# Patient Record
Sex: Female | Born: 1935
Health system: Southern US, Community
[De-identification: ages and names within clinical notes are randomized; demographics above are authoritative.]

## PROBLEM LIST (undated history)

## (undated) DIAGNOSIS — I10 Essential (primary) hypertension: Secondary | ICD-10-CM

## (undated) DIAGNOSIS — Z9889 Other specified postprocedural states: Secondary | ICD-10-CM

## (undated) DIAGNOSIS — E7800 Pure hypercholesterolemia, unspecified: Secondary | ICD-10-CM

## (undated) DIAGNOSIS — R112 Nausea with vomiting, unspecified: Secondary | ICD-10-CM

## (undated) HISTORY — DX: Essential (primary) hypertension: I10

## (undated) HISTORY — PX: CATARACT EXTRACTION: SUR2

---

## 2000-09-08 ENCOUNTER — Other Ambulatory Visit: Admission: RE | Admit: 2000-09-08 | Discharge: 2000-09-08 | Payer: Self-pay | Admitting: Dermatology

## 2001-07-07 ENCOUNTER — Emergency Department (HOSPITAL_COMMUNITY): Admission: EM | Admit: 2001-07-07 | Discharge: 2001-07-07 | Payer: Self-pay | Admitting: Emergency Medicine

## 2002-07-13 ENCOUNTER — Ambulatory Visit (HOSPITAL_COMMUNITY): Admission: RE | Admit: 2002-07-13 | Discharge: 2002-07-13 | Payer: Self-pay | Admitting: Pulmonary Disease

## 2002-08-06 ENCOUNTER — Ambulatory Visit (HOSPITAL_COMMUNITY): Admission: RE | Admit: 2002-08-06 | Discharge: 2002-08-06 | Payer: Self-pay | Admitting: Orthopaedic Surgery

## 2002-08-06 ENCOUNTER — Encounter: Payer: Self-pay | Admitting: Orthopaedic Surgery

## 2010-05-10 ENCOUNTER — Other Ambulatory Visit: Payer: Self-pay | Admitting: Ophthalmology

## 2010-05-10 ENCOUNTER — Encounter (HOSPITAL_COMMUNITY): Payer: Medicare Other | Attending: Ophthalmology

## 2010-05-10 DIAGNOSIS — Z01812 Encounter for preprocedural laboratory examination: Secondary | ICD-10-CM | POA: Insufficient documentation

## 2010-05-10 DIAGNOSIS — Z01810 Encounter for preprocedural cardiovascular examination: Secondary | ICD-10-CM | POA: Insufficient documentation

## 2010-05-10 LAB — BASIC METABOLIC PANEL
BUN: 11 mg/dL (ref 6–23)
CO2: 27 mEq/L (ref 19–32)
Chloride: 101 mEq/L (ref 96–112)
Creatinine, Ser: 0.62 mg/dL (ref 0.4–1.2)
Potassium: 4 mEq/L (ref 3.5–5.1)

## 2010-05-10 LAB — HEMOGLOBIN AND HEMATOCRIT, BLOOD: HCT: 38.2 % (ref 36.0–46.0)

## 2010-05-17 ENCOUNTER — Ambulatory Visit (HOSPITAL_COMMUNITY)
Admission: RE | Admit: 2010-05-17 | Discharge: 2010-05-17 | Disposition: A | Discharge status: Home / Self Care | Payer: Medicare Other | Source: Ambulatory Visit | Attending: Ophthalmology | Admitting: Ophthalmology

## 2010-05-17 DIAGNOSIS — H2510 Age-related nuclear cataract, unspecified eye: Secondary | ICD-10-CM | POA: Insufficient documentation

## 2010-08-01 ENCOUNTER — Encounter (HOSPITAL_COMMUNITY)
Admission: RE | Admit: 2010-08-01 | Discharge: 2010-08-01 | Disposition: A | Discharge status: Home / Self Care | Payer: Medicare Other | Source: Ambulatory Visit | Attending: Ophthalmology | Admitting: Ophthalmology

## 2010-08-01 LAB — BASIC METABOLIC PANEL
CO2: 30 mEq/L (ref 19–32)
Chloride: 102 mEq/L (ref 96–112)
Creatinine, Ser: 0.67 mg/dL (ref 0.50–1.10)
GFR calc Af Amer: >60 mL/min (ref >60)
Potassium: 4.2 mEq/L (ref 3.5–5.1)

## 2010-08-01 LAB — HEMOGLOBIN AND HEMATOCRIT, BLOOD
HCT: 37.8 % (ref 36.0–46.0)
Hemoglobin: 12.7 g/dL (ref 12.0–15.0)

## 2010-08-16 ENCOUNTER — Ambulatory Visit (HOSPITAL_COMMUNITY)
Admission: RE | Admit: 2010-08-16 | Discharge: 2010-08-16 | Disposition: A | Discharge status: Home / Self Care | Payer: Medicare Other | Source: Ambulatory Visit | Attending: Ophthalmology | Admitting: Ophthalmology

## 2010-08-16 DIAGNOSIS — Z01812 Encounter for preprocedural laboratory examination: Secondary | ICD-10-CM | POA: Insufficient documentation

## 2010-08-16 DIAGNOSIS — H2589 Other age-related cataract: Secondary | ICD-10-CM | POA: Insufficient documentation

## 2010-08-16 DIAGNOSIS — Z7982 Long term (current) use of aspirin: Secondary | ICD-10-CM | POA: Insufficient documentation

## 2010-08-20 NOTE — Op Note (Signed)
  NAMEGRISELLE, Hawkins NO.:  [id number]  MEDICAL RECORD NO.:  [id number]  LOCATION:  [facility]                          FACILITY:  [facility]  PHYSICIAN:  Susanne Greenhouse, MD       DATE OF BIRTH:  1930-12-28  DATE OF PROCEDURE:  08/16/2010 DATE OF DISCHARGE:                              OPERATIVE REPORT   PREOPERATIVE DIAGNOSIS:  Combined cataract, left eye.  Diagnosis code 366.19.  POSTOPERATIVE DIAGNOSIS:  Combined cataract, left eye.  Diagnosis code 366.19.  OPERATION PERFORMED:  Phacoemulsification with posterior chamber intraocular lens implantation, left eye.  No surgical specimens.  SURGEON:  Susanne Greenhouse, MD  ANESTHESIA:  General endotracheal anesthesia.  OPERATIVE SUMMARY:  In the preoperative area, dilating drops were placed into the left eye.  The patient was then brought into the operating room where she was placed under general anesthesia.  The eye was then prepped and draped.  Beginning with a 75 blade, a paracentesis port was made at the surgeon's 2 o'clock position.  The anterior chamber was then filled with a 1% nonpreserved lidocaine solution with epinephrine.  This was followed by Viscoat to deepen the chamber.  A small fornix-based peritomy was performed superiorly.  Next, a single iris hook was placed through the limbus superiorly.  A 2.4-mm keratome blade was then used to make a clear corneal incision over the iris hook.  A bent cystotome needle and Utrata forceps were used to create a continuous tear capsulotomy.  Hydrodissection was performed using balanced salt solution on a fine cannula.  The lens nucleus was then removed using phacoemulsification in a quadrant cracking technique.  The cortical material was then removed with irrigation and aspiration.  The capsular bag and anterior chamber were refilled with Provisc.  The wound was widened to approximately 3 mm and a posterior chamber intraocular lens was placed into the capsular bag  without difficulty using an [employer] lens injecting system.  A single 10-0 nylon suture was then used to close the incision as well as stromal hydration.  The Provisc was removed from the anterior chamber and capsular bag with irrigation and aspiration.  At this point, the wounds were tested for leak, which were negative.  The anterior chamber remained deep and stable.  The patient tolerated the procedure well.  There were no operative complications, and she awoke from general anesthesia without problem.  Prosthetic device used is a Lenstec posterior chamber lens model Softec HD, power of 21.5, serial number is [id number].          ______________________________ Susanne Greenhouse, MD     KEH/MEDQ  D:  08/16/2010  T:  08/17/2010  Job:  [id number]  Electronically Signed by Gemma Payor MD on 08/20/2010 12:44:57 PM

## 2012-02-23 ENCOUNTER — Encounter (HOSPITAL_COMMUNITY): Payer: Self-pay | Admitting: *Deleted

## 2012-02-23 ENCOUNTER — Emergency Department (HOSPITAL_COMMUNITY)
Admission: EM | Admit: 2012-02-23 | Discharge: 2012-02-23 | Disposition: A | Discharge status: Home / Self Care | Payer: Medicare Other | Attending: Emergency Medicine | Admitting: Emergency Medicine

## 2012-02-23 ENCOUNTER — Emergency Department (HOSPITAL_COMMUNITY): Payer: Medicare Other

## 2012-02-23 DIAGNOSIS — Z87891 Personal history of nicotine dependence: Secondary | ICD-10-CM | POA: Insufficient documentation

## 2012-02-23 DIAGNOSIS — Z79899 Other long term (current) drug therapy: Secondary | ICD-10-CM | POA: Insufficient documentation

## 2012-02-23 DIAGNOSIS — Y93E8 Activity, other personal hygiene: Secondary | ICD-10-CM | POA: Insufficient documentation

## 2012-02-23 DIAGNOSIS — E7800 Pure hypercholesterolemia, unspecified: Secondary | ICD-10-CM | POA: Insufficient documentation

## 2012-02-23 DIAGNOSIS — S62109A Fracture of unspecified carpal bone, unspecified wrist, initial encounter for closed fracture: Secondary | ICD-10-CM

## 2012-02-23 DIAGNOSIS — W19XXXA Unspecified fall, initial encounter: Secondary | ICD-10-CM | POA: Insufficient documentation

## 2012-02-23 DIAGNOSIS — S52509A Unspecified fracture of the lower end of unspecified radius, initial encounter for closed fracture: Secondary | ICD-10-CM | POA: Insufficient documentation

## 2012-02-23 DIAGNOSIS — Y92009 Unspecified place in unspecified non-institutional (private) residence as the place of occurrence of the external cause: Secondary | ICD-10-CM | POA: Insufficient documentation

## 2012-02-23 HISTORY — DX: Pure hypercholesterolemia, unspecified: E78.00

## 2012-02-23 MED ORDER — OXYCODONE-ACETAMINOPHEN 5-325 MG PO TABS: 1.0000 | ORAL_TABLET | Freq: Once | ORAL | Status: DC

## 2012-02-23 MED ORDER — OXYCODONE-ACETAMINOPHEN 5-325 MG PO TABS
1.0000 | ORAL_TABLET | Freq: Once | ORAL | Status: AC
  Administered 2012-02-23: 1 via ORAL
  Filled 2012-02-23: qty 1

## 2012-02-23 MED ORDER — OXYCODONE-ACETAMINOPHEN 5-325 MG PO TABS: 1.0000 | ORAL_TABLET | ORAL | Status: DC | PRN

## 2012-02-23 NOTE — ED Notes (Addendum)
Pt states that she fell while trying to put on her pants, unsure of what caused her to fall when asked, pt c/o pain to left wrist area, pain and swelling noted to left wrist, cms intact distal. Ice pack and splint applied at triage. Cms remains intact after splint

## 2012-02-23 NOTE — ED Provider Notes (Signed)
History     CSN: [id number]  Arrival date & time 02/23/12  1547   First MD Initiated Contact with Patient 02/23/12 1713      Chief Complaint  Patient presents with  . Fall  . Wrist Pain    (Consider location/radiation/quality/duration/timing/severity/associated sxs/prior treatment) HPI Patient lost balance and fell putting on pants this a.m.  She injured left wrist, but denies other injuries.  Denies head injury or loc, chest pain.  Complaining of pain left wrist, patient states fingers feel numb with ice in place.  PMD- Juanetta Gosling, orthoHilda Lias.  Past Medical History  Diagnosis Date  . High cholesterol     Past Surgical History  Procedure Date  . Cataract extraction     No family history on file.  History  Substance Use Topics  . Smoking status: Former Games developer  . Smokeless tobacco: Not on file  . Alcohol Use: No    OB History    Grav Para Term Preterm Abortions TAB SAB Ect Mult Living                  Review of Systems  All other systems reviewed and are negative.    Allergies  Review of patient's allergies indicates no known allergies.  Home Medications   Current Outpatient Rx  Name  Route  Sig  Dispense  Refill  . SIMVASTATIN 20 MG PO TABS   Oral   Take 20 mg by mouth daily.           BP 148/78  Pulse 77  Temp 97.9 F (36.6 C) (Oral)  Resp 16  Ht 5\' 5"  (1.651 m)  Wt 175 lb (79.379 kg)  BMI 29.12 kg/m2  SpO2 100%  Physical Exam  Nursing note and vitals reviewed. Constitutional: She is oriented to person, place, and time. She appears well-developed and well-nourished.  HENT:  Head: Normocephalic and atraumatic.  Eyes: Conjunctivae normal are normal. Pupils are equal, round, and reactive to light.  Neck: Normal range of motion. Neck supple.  Cardiovascular: Normal rate.   Pulmonary/Chest: Effort normal.  Abdominal: Soft.  Musculoskeletal: She exhibits tenderness.       Swelling tenderness left wrist, fingers pink with 2 point sensation  and movement intact.  Radial pulse two plus, no tenderness over elbow or shoulder. Entire spine nontender  Neurological: She is alert and oriented to person, place, and time.  Skin: Skin is warm and dry.  Psychiatric: She has a normal mood and affect.    ED Course  Procedures (including critical care time)  Labs Reviewed - No data to display Dg Wrist Complete Left  02/23/2012  *RADIOLOGY REPORT*  Clinical Data: Left wrist pain and swelling secondary to a fall.  LEFT WRIST - COMPLETE 3+ VIEW  Comparison: None.  Findings: There is an avulsion of the ulnar styloid with a dorsally impacted fracture of the distal radius.  Fairly severe arthritic changes of the first carpal metacarpal joint and between the scaphoid and the trapezium and trapezoid.  IMPRESSION: Fractures of the distal radius and ulna as described.  The fracture does involve the articular surface of the distal radius.   Original Report Authenticated By: Francene Boyers, M.D.      No diagnosis found.    MDM  Wrist fracture.  Sugar tong splint placed and discussed with Dr. Romeo Apple and patient will be seen by him in follow up tomorrow at 1430.       Hilario Quarry, MD 02/23/12 2108

## 2012-02-23 NOTE — ED Notes (Signed)
Patient with no complaints at this time. Respirations even and unlabored. Skin warm/dry. Discharge instructions reviewed with patient at this time. Patient given opportunity to voice concerns/ask questions. Patient discharged at this time and left Emergency Department with steady gait.   

## 2012-02-24 ENCOUNTER — Encounter: Payer: Self-pay | Admitting: Orthopedic Surgery

## 2012-02-24 ENCOUNTER — Ambulatory Visit (INDEPENDENT_AMBULATORY_CARE_PROVIDER_SITE_OTHER): Payer: Medicare Other | Admitting: Orthopedic Surgery

## 2012-02-24 VITALS — BP 140/58 | Ht 65.0 in | Wt 175.0 lb

## 2012-02-24 DIAGNOSIS — S52599A Other fractures of lower end of unspecified radius, initial encounter for closed fracture: Secondary | ICD-10-CM

## 2012-02-24 DIAGNOSIS — S52509A Unspecified fracture of the lower end of unspecified radius, initial encounter for closed fracture: Secondary | ICD-10-CM | POA: Insufficient documentation

## 2012-02-24 MED ORDER — OXYCODONE-ACETAMINOPHEN 5-325 MG PO TABS: 1.0000 | ORAL_TABLET | ORAL | Status: DC | PRN

## 2012-02-24 NOTE — Progress Notes (Deleted)
Patient ID: Tammy Hawkins, female   DOB: [birth year], 79 y.o.   MRN: [id number] Chief Complaint  Patient presents with  . Wrist Injury    ER follow up, left wrist fracture. DOI 02-23-12.    This is a 79 year old female who jumped off a car to avoid being bitten by a dog on Friday, 02/21/2012. He fell injured his right knee now he can extend it. He was evaluated in the emergency room and found to have a torn quadriceps muscle. He had an MRI which showed complete tear with 2.5 cm of retraction. He comes in with bruising swelling lack of knee extension and he is on crutches is not complaining of a significant amount of pain  This overboard review of systems includes constipation blurred vision and watering of the eyes he denies weight gain weight loss chest pain shortness of breath frequency or urgency skin changes numbness tingling nervousness anxiety easy bleeding or bruising excessive thirst or urination or adverse reactions to food  Past Medical History  Diagnosis Date  . High cholesterol     Past Surgical History  Procedure Date  . Cataract extraction     Physical Exam  Nursing note and vitals reviewed. Constitutional: She is oriented to person, place, and time. She appears well-developed and well-nourished. No distress.  HENT:  Head: Normocephalic.  Eyes: Pupils are equal, round, and reactive to light.  Neck: Normal range of motion. No tracheal deviation present.  Cardiovascular: Normal rate and intact distal pulses.   Pulmonary/Chest: Effort normal.  Abdominal: Soft.  Musculoskeletal:       Upper extremity exam  The right and left upper extremity:  Inspection and palpation revealed no abnormalities in the upper extremities.  Range of motion is full without contracture. Motor exam is normal with grade 5 strength. The joints are fully reduced without subluxation. There is no atrophy or tremor and muscle tone is normal.  All joints are stable.    Lower extremity  exam  Ambulation is normal.  The  left lower extremity:  Inspection and palpation revealed no tenderness or abnormality in alignment in the lower extremities. Range of motion is full.  Strength is grade 5.  All joints are stable.  Right knee evaluation. The patient has the knee in 90 flexion is no extension has a palpable defect at the quadriceps and patellar junction with swelling and ecchemosis noted in the skin. The knee is otherwise stable.  No malalignment is seen       Lymphadenopathy:    She has no cervical adenopathy.  Neurological: She is alert and oriented to person, place, and time. She has normal reflexes.  Skin: Skin is warm and dry. She is not diaphoretic.  Psychiatric: She has a normal mood and affect. Her behavior is normal. Judgment and thought content normal.   Knee x-rays were negative except for some mild arthrosis  The MRI was reviewed and there is indeed a torn quadriceps tendon from the patella with retraction  This will require surgical treatment with repair  Explain the procedure to the patient including the postoperative rehabilitation course. There is really no other alternative treatment to obtain a functional knee.  Diagnosis right quadriceps tendon rupture  Plan open repair right quadriceps tendon

## 2012-02-24 NOTE — Patient Instructions (Addendum)
Keep  Cast dry   Do not get wet   If it gets wet dry with a hair dryer on low setting and call the office   

## 2012-02-25 NOTE — Progress Notes (Signed)
Patient ID: Tammy Hawkins, female   DOB: [birth year], 79 y.o.   MRN: [id number] Chief Complaint  Patient presents with  . Wrist Injury    ER follow up, left wrist fracture. DOI 02-23-12.    Pain left wrist patient fell at home or getting dressed in her bedroom  Date of injury January 12  Emergency room  visit. Placed in splint. Complains of sharp 9/10 constant pain with numbness swelling decreased range of motion. She denies medical problems. She denies review of systems issues.   BP 140/58  Ht 5\' 5"  (1.651 m)  Wt 175 lb (79.379 kg)  BMI 29.12 kg/m2 Physical Exam(12) GENERAL: normal development   CDV: pulses are normal   Skin: normal  Lymph: nodes were not palpable/normal  Psychiatric: awake, alert and oriented  Neuro: normal sensation  Lower extremity exam  Ambulation is normal.  The right and left lower extremity:  Inspection and palpation revealed no tenderness or abnormality in alignment in the lower extremities. Range of motion is full.  Strength is grade 5.  All joints are stable.    Left arm evaluation tenderness over the distal radius mild dorsal prominence. Pain swelling loss of motion normal muscle tone. Decreased strength. No instability.  X-ray shows a slightly dorsally displaced slightly angulated distal radius fracture.  I discussed options with the patient my recommendations worse for closed reduction and pinning she says she has taken her disabled husband and cannot do the surgery  She was placed in a short arm cast  X-ray in 2 weeks  Diagnosis left distal radius fracture  Plan short arm cast treatment.

## 2012-02-25 NOTE — Addendum Note (Signed)
Addended by: Vickki Hearing on: 02/25/2012 08:35 AM   Modules accepted: Level of Service

## 2012-02-26 ENCOUNTER — Ambulatory Visit: Payer: Medicare Other | Admitting: Orthopedic Surgery

## 2012-03-09 ENCOUNTER — Ambulatory Visit (INDEPENDENT_AMBULATORY_CARE_PROVIDER_SITE_OTHER): Payer: Medicare Other | Admitting: Orthopedic Surgery

## 2012-03-09 ENCOUNTER — Ambulatory Visit (INDEPENDENT_AMBULATORY_CARE_PROVIDER_SITE_OTHER): Payer: Medicare Other

## 2012-03-09 VITALS — Ht 65.0 in | Wt 175.0 lb

## 2012-03-09 DIAGNOSIS — S52509A Unspecified fracture of the lower end of unspecified radius, initial encounter for closed fracture: Secondary | ICD-10-CM

## 2012-03-09 DIAGNOSIS — S52599A Other fractures of lower end of unspecified radius, initial encounter for closed fracture: Secondary | ICD-10-CM

## 2012-03-09 DIAGNOSIS — S62109A Fracture of unspecified carpal bone, unspecified wrist, initial encounter for closed fracture: Secondary | ICD-10-CM

## 2012-03-09 MED ORDER — OXYCODONE-ACETAMINOPHEN 5-325 MG PO TABS: 1.0000 | ORAL_TABLET | ORAL | Status: DC | PRN

## 2012-03-09 NOTE — Patient Instructions (Addendum)
Keep  Cast dry   Do not get wet   If it gets wet dry with a hair dryer on low setting and call the office   

## 2012-03-09 NOTE — Progress Notes (Signed)
Patient ID: Tammy Hawkins, female   DOB: [birth year], 79 y.o.   MRN: [id number] Chief Complaint  Patient presents with  . Follow-up    2 week recheck on left wrist fracture. DOI 02-23-12.    X-ray in the cast shows no change in position of the intra-articular distal radius fracture. She is maintained relative height and radial inclination with a neutral joint surface  Cast intact  Continue current pain medication x-ray out of plaster in 2 weeks

## 2012-03-30 ENCOUNTER — Ambulatory Visit (INDEPENDENT_AMBULATORY_CARE_PROVIDER_SITE_OTHER): Payer: Medicare Other

## 2012-03-30 ENCOUNTER — Encounter: Payer: Self-pay | Admitting: Orthopedic Surgery

## 2012-03-30 ENCOUNTER — Ambulatory Visit (INDEPENDENT_AMBULATORY_CARE_PROVIDER_SITE_OTHER): Payer: Medicare Other | Admitting: Orthopedic Surgery

## 2012-03-30 VITALS — BP 140/60 | Ht 65.0 in | Wt 175.0 lb

## 2012-03-30 DIAGNOSIS — S62109A Fracture of unspecified carpal bone, unspecified wrist, initial encounter for closed fracture: Secondary | ICD-10-CM

## 2012-03-30 MED ORDER — HYDROCODONE-ACETAMINOPHEN 5-325 MG PO TABS: 1.0000 | ORAL_TABLET | ORAL | Status: DC | PRN

## 2012-03-30 NOTE — Progress Notes (Signed)
Patient ID: Tammy Hawkins, female   DOB: 02-23-1930, 79 y.o.   MRN: [id number] Chief Complaint  Patient presents with  . Follow-up    Follow up left wrist fracture DOI 02/23/12    BP 140/60  Ht 5\' 5"  (1.651 m)  Wt 175 lb (79.379 kg)  BMI 29.12 kg/m2  The patient has tenderness at the area of the hand. No tenderness at the fracture site.  Healed distal radius fracture.  Xray report  LEFT wrist x-rays.  3 views.  Fracture.  Comminuted fracture, distal radius with normal axial alignment on AP and lateral x-rays. There is some comminution and callus forming around the fracture. No displacement.  Impression healing distal radius fracture. The LEFT wrist  Advices to perform exercises as I have instructed, she is placed in a cockup wrist splint removable to wear for one month.  Follow with [place] as needed

## 2012-03-30 NOTE — Patient Instructions (Signed)
Wear brace x 1 month then remove  Activities as tolerated after 1 month   Exercises as instructed   Return as needed

## 2012-04-06 ENCOUNTER — Telehealth: Payer: Self-pay | Admitting: *Deleted

## 2012-04-06 NOTE — Telephone Encounter (Signed)
Patient called and stated Saturday and Sunday she was having pain in left wrist. Also, complained of  her fingers going numb after she would take her brace off and not having any strength to hold anything. Advised patient that maybe she needed to try losing the brace and told her it would take time to regain strength in her wrist after the fracture, but to continue exercises that was given to her. Patient did state that she was not having any pain this morning and denies numbness. Told patient to call back for appointment if symptoms did not improve.

## 2012-04-20 ENCOUNTER — Encounter: Payer: Self-pay | Admitting: Orthopedic Surgery

## 2012-04-20 ENCOUNTER — Ambulatory Visit (INDEPENDENT_AMBULATORY_CARE_PROVIDER_SITE_OTHER): Payer: Medicare Other

## 2012-04-20 ENCOUNTER — Ambulatory Visit (INDEPENDENT_AMBULATORY_CARE_PROVIDER_SITE_OTHER): Payer: Medicare Other | Admitting: Orthopedic Surgery

## 2012-04-20 VITALS — BP 130/70 | Ht 65.0 in | Wt 175.0 lb

## 2012-04-20 DIAGNOSIS — M19049 Primary osteoarthritis, unspecified hand: Secondary | ICD-10-CM | POA: Insufficient documentation

## 2012-04-20 DIAGNOSIS — M25532 Pain in left wrist: Secondary | ICD-10-CM

## 2012-04-20 DIAGNOSIS — M25539 Pain in unspecified wrist: Secondary | ICD-10-CM

## 2012-04-20 DIAGNOSIS — S52532A Colles' fracture of left radius, initial encounter for closed fracture: Secondary | ICD-10-CM | POA: Insufficient documentation

## 2012-04-20 DIAGNOSIS — G5602 Carpal tunnel syndrome, left upper limb: Secondary | ICD-10-CM

## 2012-04-20 DIAGNOSIS — G5600 Carpal tunnel syndrome, unspecified upper limb: Secondary | ICD-10-CM | POA: Insufficient documentation

## 2012-04-20 DIAGNOSIS — S52532S Colles' fracture of left radius, sequela: Secondary | ICD-10-CM

## 2012-04-20 DIAGNOSIS — S42309S Unspecified fracture of shaft of humerus, unspecified arm, sequela: Secondary | ICD-10-CM

## 2012-04-20 MED ORDER — HYDROCODONE-ACETAMINOPHEN 7.5-325 MG PO TABS: 1.0000 | ORAL_TABLET | ORAL | Status: DC | PRN

## 2012-04-20 NOTE — Progress Notes (Signed)
Patient ID: Tammy Hawkins, female   DOB: Apr 02, 1930, 79 y.o.   MRN: [id number] Chief Complaint  Patient presents with  . Follow-up    Having severe pain left wrist   BP 130/70  Ht 5\' 5"  (1.651 m)  Wt 175 lb (79.379 kg)  BMI 29.12 kg/m2  The patient had a fracture of her distal radius retreated close with casting followed by bracing she presents back with severe pain in her left hand and wrist and numbness and tingling in her fingers. She's been in a fracture brace for several weeks now  The pain is in the base of the left thumb as well as the top of the hand and somewhat over the distal radius  Review of systems reveals complaints of numbness and tingling and weakness in the left hand with difficulty picking up small objects and difficulty with fine motor tasks  BP 130/70  Ht 5\' 5"  (1.651 m)  Wt 175 lb (79.379 kg)  BMI 29.12 kg/m2 General appearance is normal, the patient is alert and oriented x3 with normal mood and affect. Left hand and wrist evaluation  Radial and ulnar pulses are normal. No lymphadenopathy is noted in the epitrochlear region.   Swelling is noted over the distal radius but I think this is more from the brace fitting and the residual position of the fracture. She has a little tenderness at the fracture but more pain and tenderness at the base of the second and third metacarpal at the carpometacarpal joint. She has tenderness at the base of the thumb with a positive grind test. She has decreased sensation in her fingertips. Her grip strength is weak her range of motion at the wrist joint is less than normal. No instability is felt scaphoid is nontender.  X-ray was repeated  X-ray shows acceptable alignment of the fracture with some mild displacement and angulation but within the criteria for nonoperative treatment with less than 10 mm of shortening less than 5 angulation  Impression Colles' fracture of left radius, sequela - Plan: HYDROcodone-acetaminophen (NORCO)  7.5-325 MG per tablet  Wrist pain, left - Plan: DG Wrist Complete Left, HYDROcodone-acetaminophen (NORCO) 7.5-325 MG per tablet  CTS (carpal tunnel syndrome), left - Plan: HYDROcodone-acetaminophen (NORCO) 7.5-325 MG per tablet  Arthritis of hand, degenerative, left - Plan: HYDROcodone-acetaminophen (NORCO) 7.5-325 MG per tablet    Increase her pain medication for the next 3-4 weeks. Apply topical cream for the next 3-4 weeks 4 times a day wear a carpal tunnel splint. Repeat evaluation in 4 weeks.

## 2012-04-20 NOTE — Patient Instructions (Addendum)
Diagnosis: Carpal tunnel syndrome Arthritis   Apply warm compress to hand   Apply muscle rub 4 x a day   Wear splint until you come back   Carpal Tunnel Syndrome The carpal tunnel is a narrow area located on the palm side of your wrist. The tunnel is formed by the wrist bones and ligaments. Nerves, blood vessels, and tendons pass through the carpal tunnel. Repeated wrist motion or certain diseases may cause swelling within the tunnel. This swelling pinches the main nerve in the wrist (median nerve) and causes the painful hand and arm condition called carpal tunnel syndrome. CAUSES   Repeated wrist motions.  Wrist injuries.  Certain diseases like arthritis, diabetes, alcoholism, hyperthyroidism, and kidney failure.  Obesity.  Pregnancy. SYMPTOMS   A "pins and needles" feeling in your fingers or hand.  Tingling or numbness in your fingers or hand.  An aching feeling in your entire arm.  Wrist pain that goes up your arm to your shoulder.  Pain that goes down into your palm or fingers.  A weak feeling in your hands. DIAGNOSIS  Your caregiver will take your history and perform a physical exam. An electromyography test may be needed. This test measures electrical signals sent out by the muscles. The electrical signals are usually slowed by carpal tunnel syndrome. You may also need X-rays. TREATMENT  Carpal tunnel syndrome may clear up by itself. Your caregiver may recommend a wrist splint or medicine such as a nonsteroidal anti-inflammatory medicine. Cortisone injections may help. Sometimes, surgery may be needed to free the pinched nerve.  HOME CARE INSTRUCTIONS   Take all medicine as directed by your caregiver. Only take over-the-counter or prescription medicines for pain, discomfort, or fever as directed by your caregiver.  If you were given a splint to keep your wrist from bending, wear it as directed. It is important to wear the splint at night. Wear the splint for as long  as you have pain or numbness in your hand, arm, or wrist. This may take 1 to 2 months.  Rest your wrist from any activity that may be causing your pain. If your symptoms are work-related, you may need to talk to your employer about changing to a job that does not require using your wrist.  Put ice on your wrist after long periods of wrist activity.  Put ice in a plastic bag.  Place a towel between your skin and the bag.  Leave the ice on for 15 to 20 minutes, 3 to 4 times a day.  Keep all follow-up visits as directed by your caregiver. This includes any orthopedic referrals, physical therapy, and rehabilitation. Any delay in getting necessary care could result in a delay or failure of your condition to heal. SEEK IMMEDIATE MEDICAL CARE IF:   You have new, unexplained symptoms.  Your symptoms get worse and are not helped or controlled with medicines. MAKE SURE YOU:   Understand these instructions.  Will watch your condition.  Will get help right away if you are not doing well or get worse. Document Released: 01/26/2000 Document Revised: 04/22/2011 Document Reviewed: 12/14/2010 [facility] Patient Information 2013 [place], [place].

## 2012-05-20 ENCOUNTER — Ambulatory Visit (INDEPENDENT_AMBULATORY_CARE_PROVIDER_SITE_OTHER): Payer: Medicare Other | Admitting: Orthopedic Surgery

## 2012-05-20 ENCOUNTER — Encounter: Payer: Self-pay | Admitting: Orthopedic Surgery

## 2012-05-20 VITALS — BP 118/60 | Ht 65.0 in | Wt 175.0 lb

## 2012-05-20 DIAGNOSIS — S52532S Colles' fracture of left radius, sequela: Secondary | ICD-10-CM

## 2012-05-20 DIAGNOSIS — S42309S Unspecified fracture of shaft of humerus, unspecified arm, sequela: Secondary | ICD-10-CM

## 2012-05-20 DIAGNOSIS — G5602 Carpal tunnel syndrome, left upper limb: Secondary | ICD-10-CM

## 2012-05-20 DIAGNOSIS — G5600 Carpal tunnel syndrome, unspecified upper limb: Secondary | ICD-10-CM

## 2012-05-20 DIAGNOSIS — M19049 Primary osteoarthritis, unspecified hand: Secondary | ICD-10-CM

## 2012-05-20 NOTE — Progress Notes (Signed)
Patient ID: Tammy Hawkins, female   DOB: 10-12-1930, 79 y.o.   MRN: [id number] Chief Complaint  Patient presents with  . Follow-up    4 week recheck of CTS and OA of left hand.    History this patient had a fractured left distal radius several months ago presents now with continued pain and numbness and tingling and weakness of her left upper extremity. She's had a course of therapy and strengthening exercises with no avail but she does respond well to her wrist splint.  She denies pain radiating up into the shoulder  BP 118/60  Ht 5\' 5"  (1.651 m)  Wt 175 lb (79.379 kg)  BMI 29.12 kg/m2 On inspection we find a normal alignment left wrist with normal passive range of motion slight decreased flexion and weakness in power grip wrist joint is stable there is tenderness over the joint and the carpal tunnel skin is intact incision healed with negative Tinel's normal perfusion and decreased sensation in all 5 digits  Impression posttraumatic carpal tunnel syndrome left upper extremity  Are recommended surgery she would like to wait continue strengthening exercises and splinting and come back in 3 months or sooner if she decides to have surgery

## 2012-05-20 NOTE — Patient Instructions (Addendum)
activities as tolerated   Work on strengthening of the hand   Carpal Tunnel Syndrome The carpal tunnel is a narrow area located on the palm side of your wrist. The tunnel is formed by the wrist bones and ligaments. Nerves, blood vessels, and tendons pass through the carpal tunnel. Repeated wrist motion or certain diseases may cause swelling within the tunnel. This swelling pinches the main nerve in the wrist (median nerve) and causes the painful hand and arm condition called carpal tunnel syndrome. CAUSES   Repeated wrist motions.  Wrist injuries.  Certain diseases like arthritis, diabetes, alcoholism, hyperthyroidism, and kidney failure.  Obesity.  Pregnancy. SYMPTOMS   A "pins and needles" feeling in your fingers or hand.  Tingling or numbness in your fingers or hand.  An aching feeling in your entire arm.  Wrist pain that goes up your arm to your shoulder.  Pain that goes down into your palm or fingers.  A weak feeling in your hands. DIAGNOSIS  Your caregiver will take your history and perform a physical exam. An electromyography test may be needed. This test measures electrical signals sent out by the muscles. The electrical signals are usually slowed by carpal tunnel syndrome. You may also need X-rays. TREATMENT  Carpal tunnel syndrome may clear up by itself. Your caregiver may recommend a wrist splint or medicine such as a nonsteroidal anti-inflammatory medicine. Cortisone injections may help. Sometimes, surgery may be needed to free the pinched nerve.  HOME CARE INSTRUCTIONS   Take all medicine as directed by your caregiver. Only take over-the-counter or prescription medicines for pain, discomfort, or fever as directed by your caregiver.  If you were given a splint to keep your wrist from bending, wear it as directed. It is important to wear the splint at night. Wear the splint for as long as you have pain or numbness in your hand, arm, or wrist. This may take 1 to 2  months.  Rest your wrist from any activity that may be causing your pain. If your symptoms are work-related, you may need to talk to your employer about changing to a job that does not require using your wrist.  Put ice on your wrist after long periods of wrist activity.  Put ice in a plastic bag.  Place a towel between your skin and the bag.  Leave the ice on for 15 to 20 minutes, 3 to 4 times a day.  Keep all follow-up visits as directed by your caregiver. This includes any orthopedic referrals, physical therapy, and rehabilitation. Any delay in getting necessary care could result in a delay or failure of your condition to heal. SEEK IMMEDIATE MEDICAL CARE IF:   You have new, unexplained symptoms.  Your symptoms get worse and are not helped or controlled with medicines. MAKE SURE YOU:   Understand these instructions.  Will watch your condition.  Will get help right away if you are not doing well or get worse. Document Released: 01/26/2000 Document Revised: 04/22/2011 Document Reviewed: 12/14/2010 [facility] Patient Information 2013 [place], [place].

## 2014-05-17 DIAGNOSIS — I10 Essential (primary) hypertension: Secondary | ICD-10-CM | POA: Diagnosis not present

## 2014-05-31 DIAGNOSIS — I10 Essential (primary) hypertension: Secondary | ICD-10-CM | POA: Diagnosis not present

## 2014-06-13 DIAGNOSIS — R7309 Other abnormal glucose: Secondary | ICD-10-CM | POA: Diagnosis not present

## 2014-06-13 DIAGNOSIS — R635 Abnormal weight gain: Secondary | ICD-10-CM | POA: Diagnosis not present

## 2014-06-13 DIAGNOSIS — I10 Essential (primary) hypertension: Secondary | ICD-10-CM | POA: Diagnosis not present

## 2014-06-13 DIAGNOSIS — M1990 Unspecified osteoarthritis, unspecified site: Secondary | ICD-10-CM | POA: Diagnosis not present

## 2014-06-13 DIAGNOSIS — E785 Hyperlipidemia, unspecified: Secondary | ICD-10-CM | POA: Diagnosis not present

## 2014-09-20 DIAGNOSIS — H524 Presbyopia: Secondary | ICD-10-CM | POA: Diagnosis not present

## 2014-09-20 DIAGNOSIS — Z9849 Cataract extraction status, unspecified eye: Secondary | ICD-10-CM | POA: Diagnosis not present

## 2014-09-20 DIAGNOSIS — H52223 Regular astigmatism, bilateral: Secondary | ICD-10-CM | POA: Diagnosis not present

## 2014-09-20 DIAGNOSIS — Z961 Presence of intraocular lens: Secondary | ICD-10-CM | POA: Diagnosis not present

## 2014-10-12 DIAGNOSIS — M79672 Pain in left foot: Secondary | ICD-10-CM | POA: Diagnosis not present

## 2014-10-12 DIAGNOSIS — M722 Plantar fascial fibromatosis: Secondary | ICD-10-CM | POA: Diagnosis not present

## 2014-11-03 DIAGNOSIS — M79671 Pain in right foot: Secondary | ICD-10-CM | POA: Diagnosis not present

## 2014-11-03 DIAGNOSIS — M722 Plantar fascial fibromatosis: Secondary | ICD-10-CM | POA: Diagnosis not present

## 2014-11-21 DIAGNOSIS — I10 Essential (primary) hypertension: Secondary | ICD-10-CM | POA: Diagnosis not present

## 2014-11-21 DIAGNOSIS — J201 Acute bronchitis due to Hemophilus influenzae: Secondary | ICD-10-CM | POA: Diagnosis not present

## 2014-12-14 DIAGNOSIS — J209 Acute bronchitis, unspecified: Secondary | ICD-10-CM | POA: Diagnosis not present

## 2014-12-14 DIAGNOSIS — J309 Allergic rhinitis, unspecified: Secondary | ICD-10-CM | POA: Diagnosis not present

## 2014-12-14 DIAGNOSIS — M1990 Unspecified osteoarthritis, unspecified site: Secondary | ICD-10-CM | POA: Diagnosis not present

## 2014-12-14 DIAGNOSIS — I10 Essential (primary) hypertension: Secondary | ICD-10-CM | POA: Diagnosis not present

## 2015-06-13 DIAGNOSIS — M1990 Unspecified osteoarthritis, unspecified site: Secondary | ICD-10-CM | POA: Diagnosis not present

## 2015-06-13 DIAGNOSIS — R739 Hyperglycemia, unspecified: Secondary | ICD-10-CM | POA: Diagnosis not present

## 2015-06-13 DIAGNOSIS — E785 Hyperlipidemia, unspecified: Secondary | ICD-10-CM | POA: Diagnosis not present

## 2015-06-13 DIAGNOSIS — I10 Essential (primary) hypertension: Secondary | ICD-10-CM | POA: Diagnosis not present

## 2015-10-24 DIAGNOSIS — M1990 Unspecified osteoarthritis, unspecified site: Secondary | ICD-10-CM | POA: Diagnosis not present

## 2015-10-24 DIAGNOSIS — I209 Angina pectoris, unspecified: Secondary | ICD-10-CM | POA: Diagnosis not present

## 2015-10-24 DIAGNOSIS — G4700 Insomnia, unspecified: Secondary | ICD-10-CM | POA: Diagnosis not present

## 2015-10-24 DIAGNOSIS — R739 Hyperglycemia, unspecified: Secondary | ICD-10-CM | POA: Diagnosis not present

## 2015-10-24 DIAGNOSIS — M545 Low back pain: Secondary | ICD-10-CM | POA: Diagnosis not present

## 2015-10-24 DIAGNOSIS — Z23 Encounter for immunization: Secondary | ICD-10-CM | POA: Diagnosis not present

## 2015-10-24 DIAGNOSIS — I10 Essential (primary) hypertension: Secondary | ICD-10-CM | POA: Diagnosis not present

## 2015-11-08 DIAGNOSIS — H524 Presbyopia: Secondary | ICD-10-CM | POA: Diagnosis not present

## 2016-06-10 DIAGNOSIS — M1990 Unspecified osteoarthritis, unspecified site: Secondary | ICD-10-CM | POA: Diagnosis not present

## 2016-06-10 DIAGNOSIS — I10 Essential (primary) hypertension: Secondary | ICD-10-CM | POA: Diagnosis not present

## 2016-06-10 DIAGNOSIS — R52 Pain, unspecified: Secondary | ICD-10-CM | POA: Diagnosis not present

## 2016-06-10 DIAGNOSIS — E785 Hyperlipidemia, unspecified: Secondary | ICD-10-CM | POA: Diagnosis not present

## 2016-06-10 DIAGNOSIS — R5383 Other fatigue: Secondary | ICD-10-CM | POA: Diagnosis not present

## 2016-07-12 DIAGNOSIS — M1990 Unspecified osteoarthritis, unspecified site: Secondary | ICD-10-CM | POA: Diagnosis not present

## 2016-07-12 DIAGNOSIS — I10 Essential (primary) hypertension: Secondary | ICD-10-CM | POA: Diagnosis not present

## 2016-07-12 DIAGNOSIS — J301 Allergic rhinitis due to pollen: Secondary | ICD-10-CM | POA: Diagnosis not present

## 2017-04-03 DIAGNOSIS — I10 Essential (primary) hypertension: Secondary | ICD-10-CM | POA: Diagnosis not present

## 2017-04-03 DIAGNOSIS — E785 Hyperlipidemia, unspecified: Secondary | ICD-10-CM | POA: Diagnosis not present

## 2017-04-03 DIAGNOSIS — M1990 Unspecified osteoarthritis, unspecified site: Secondary | ICD-10-CM | POA: Diagnosis not present

## 2017-04-03 DIAGNOSIS — J301 Allergic rhinitis due to pollen: Secondary | ICD-10-CM | POA: Diagnosis not present

## 2017-07-01 DIAGNOSIS — I10 Essential (primary) hypertension: Secondary | ICD-10-CM | POA: Diagnosis not present

## 2017-07-01 DIAGNOSIS — Z23 Encounter for immunization: Secondary | ICD-10-CM | POA: Diagnosis not present

## 2017-07-01 DIAGNOSIS — Z0000 Encounter for general adult medical examination without abnormal findings: Secondary | ICD-10-CM | POA: Diagnosis not present

## 2017-07-01 DIAGNOSIS — E785 Hyperlipidemia, unspecified: Secondary | ICD-10-CM | POA: Diagnosis not present

## 2017-07-01 DIAGNOSIS — R5383 Other fatigue: Secondary | ICD-10-CM | POA: Diagnosis not present

## 2017-07-01 DIAGNOSIS — M1990 Unspecified osteoarthritis, unspecified site: Secondary | ICD-10-CM | POA: Diagnosis not present

## 2017-09-10 DIAGNOSIS — R05 Cough: Secondary | ICD-10-CM | POA: Diagnosis not present

## 2017-09-10 DIAGNOSIS — J209 Acute bronchitis, unspecified: Secondary | ICD-10-CM | POA: Diagnosis not present

## 2017-10-21 DIAGNOSIS — Z9849 Cataract extraction status, unspecified eye: Secondary | ICD-10-CM | POA: Diagnosis not present

## 2017-10-21 DIAGNOSIS — H52223 Regular astigmatism, bilateral: Secondary | ICD-10-CM | POA: Diagnosis not present

## 2017-10-21 DIAGNOSIS — Z961 Presence of intraocular lens: Secondary | ICD-10-CM | POA: Diagnosis not present

## 2017-10-21 DIAGNOSIS — H5203 Hypermetropia, bilateral: Secondary | ICD-10-CM | POA: Diagnosis not present

## 2018-03-17 DIAGNOSIS — E785 Hyperlipidemia, unspecified: Secondary | ICD-10-CM | POA: Diagnosis not present

## 2018-03-17 DIAGNOSIS — M1990 Unspecified osteoarthritis, unspecified site: Secondary | ICD-10-CM | POA: Diagnosis not present

## 2018-03-17 DIAGNOSIS — I10 Essential (primary) hypertension: Secondary | ICD-10-CM | POA: Diagnosis not present

## 2018-03-17 DIAGNOSIS — G4700 Insomnia, unspecified: Secondary | ICD-10-CM | POA: Diagnosis not present

## 2018-09-03 DIAGNOSIS — Z0000 Encounter for general adult medical examination without abnormal findings: Secondary | ICD-10-CM | POA: Diagnosis not present

## 2018-12-22 DIAGNOSIS — C4490 Unspecified malignant neoplasm of skin, unspecified: Secondary | ICD-10-CM | POA: Diagnosis not present

## 2018-12-22 DIAGNOSIS — I10 Essential (primary) hypertension: Secondary | ICD-10-CM | POA: Diagnosis not present

## 2018-12-30 DIAGNOSIS — C44529 Squamous cell carcinoma of skin of other part of trunk: Secondary | ICD-10-CM | POA: Diagnosis not present

## 2019-01-14 DIAGNOSIS — C44529 Squamous cell carcinoma of skin of other part of trunk: Secondary | ICD-10-CM | POA: Diagnosis not present

## 2019-03-29 DIAGNOSIS — Z08 Encounter for follow-up examination after completed treatment for malignant neoplasm: Secondary | ICD-10-CM | POA: Diagnosis not present

## 2019-03-29 DIAGNOSIS — Z85828 Personal history of other malignant neoplasm of skin: Secondary | ICD-10-CM | POA: Diagnosis not present

## 2019-03-29 DIAGNOSIS — L820 Inflamed seborrheic keratosis: Secondary | ICD-10-CM | POA: Diagnosis not present

## 2019-03-29 DIAGNOSIS — L308 Other specified dermatitis: Secondary | ICD-10-CM | POA: Diagnosis not present

## 2019-05-14 DIAGNOSIS — Z0001 Encounter for general adult medical examination with abnormal findings: Secondary | ICD-10-CM | POA: Diagnosis not present

## 2019-05-14 DIAGNOSIS — I10 Essential (primary) hypertension: Secondary | ICD-10-CM | POA: Diagnosis not present

## 2019-05-14 DIAGNOSIS — R739 Hyperglycemia, unspecified: Secondary | ICD-10-CM | POA: Diagnosis not present

## 2019-05-14 DIAGNOSIS — Z79899 Other long term (current) drug therapy: Secondary | ICD-10-CM | POA: Diagnosis not present

## 2019-05-14 DIAGNOSIS — E785 Hyperlipidemia, unspecified: Secondary | ICD-10-CM | POA: Diagnosis not present

## 2019-05-14 DIAGNOSIS — E119 Type 2 diabetes mellitus without complications: Secondary | ICD-10-CM | POA: Diagnosis not present

## 2019-05-21 DIAGNOSIS — Z0001 Encounter for general adult medical examination with abnormal findings: Secondary | ICD-10-CM | POA: Diagnosis not present

## 2019-05-21 DIAGNOSIS — E785 Hyperlipidemia, unspecified: Secondary | ICD-10-CM | POA: Diagnosis not present

## 2019-05-21 DIAGNOSIS — I10 Essential (primary) hypertension: Secondary | ICD-10-CM | POA: Diagnosis not present

## 2019-07-06 DIAGNOSIS — R05 Cough: Secondary | ICD-10-CM | POA: Diagnosis not present

## 2019-09-06 DIAGNOSIS — R05 Cough: Secondary | ICD-10-CM | POA: Diagnosis not present

## 2019-10-16 DIAGNOSIS — I672 Cerebral atherosclerosis: Secondary | ICD-10-CM | POA: Diagnosis not present

## 2019-10-16 DIAGNOSIS — H8110 Benign paroxysmal vertigo, unspecified ear: Secondary | ICD-10-CM | POA: Diagnosis not present

## 2019-10-16 DIAGNOSIS — M25512 Pain in left shoulder: Secondary | ICD-10-CM | POA: Diagnosis not present

## 2019-10-16 DIAGNOSIS — G8929 Other chronic pain: Secondary | ICD-10-CM | POA: Diagnosis not present

## 2019-10-16 DIAGNOSIS — G9389 Other specified disorders of brain: Secondary | ICD-10-CM | POA: Diagnosis not present

## 2019-10-16 DIAGNOSIS — G319 Degenerative disease of nervous system, unspecified: Secondary | ICD-10-CM | POA: Diagnosis not present

## 2019-10-16 DIAGNOSIS — N390 Urinary tract infection, site not specified: Secondary | ICD-10-CM | POA: Diagnosis not present

## 2019-10-16 DIAGNOSIS — M5416 Radiculopathy, lumbar region: Secondary | ICD-10-CM | POA: Diagnosis not present

## 2019-10-16 DIAGNOSIS — R42 Dizziness and giddiness: Secondary | ICD-10-CM | POA: Diagnosis not present

## 2019-10-16 DIAGNOSIS — R200 Anesthesia of skin: Secondary | ICD-10-CM | POA: Diagnosis not present

## 2019-10-20 DIAGNOSIS — I10 Essential (primary) hypertension: Secondary | ICD-10-CM | POA: Diagnosis not present

## 2019-10-20 DIAGNOSIS — Z823 Family history of stroke: Secondary | ICD-10-CM | POA: Diagnosis not present

## 2019-10-20 DIAGNOSIS — N390 Urinary tract infection, site not specified: Secondary | ICD-10-CM | POA: Diagnosis not present

## 2019-10-21 ENCOUNTER — Other Ambulatory Visit (HOSPITAL_COMMUNITY): Payer: Self-pay | Admitting: Internal Medicine

## 2019-10-21 DIAGNOSIS — R42 Dizziness and giddiness: Secondary | ICD-10-CM

## 2019-10-21 DIAGNOSIS — R202 Paresthesia of skin: Secondary | ICD-10-CM

## 2019-10-28 ENCOUNTER — Other Ambulatory Visit: Payer: Self-pay

## 2019-10-28 ENCOUNTER — Ambulatory Visit (HOSPITAL_COMMUNITY)
Admission: RE | Admit: 2019-10-28 | Discharge: 2019-10-28 | Disposition: A | Discharge status: Home / Self Care | Payer: Medicare Other | Source: Ambulatory Visit | Attending: Internal Medicine | Admitting: Internal Medicine

## 2019-10-28 DIAGNOSIS — R202 Paresthesia of skin: Secondary | ICD-10-CM

## 2019-10-28 DIAGNOSIS — R42 Dizziness and giddiness: Secondary | ICD-10-CM | POA: Diagnosis not present

## 2019-10-28 DIAGNOSIS — R200 Anesthesia of skin: Secondary | ICD-10-CM | POA: Diagnosis not present

## 2019-10-28 DIAGNOSIS — R531 Weakness: Secondary | ICD-10-CM | POA: Diagnosis not present

## 2019-10-28 DIAGNOSIS — H748X2 Other specified disorders of left middle ear and mastoid: Secondary | ICD-10-CM | POA: Diagnosis not present

## 2019-10-28 DIAGNOSIS — G319 Degenerative disease of nervous system, unspecified: Secondary | ICD-10-CM | POA: Diagnosis not present

## 2019-11-01 ENCOUNTER — Other Ambulatory Visit (HOSPITAL_COMMUNITY): Payer: Self-pay | Admitting: Internal Medicine

## 2019-11-01 DIAGNOSIS — Z8673 Personal history of transient ischemic attack (TIA), and cerebral infarction without residual deficits: Secondary | ICD-10-CM

## 2019-11-05 ENCOUNTER — Ambulatory Visit (HOSPITAL_COMMUNITY)
Admission: RE | Admit: 2019-11-05 | Discharge: 2019-11-05 | Disposition: A | Discharge status: Home / Self Care | Payer: Medicare Other | Source: Ambulatory Visit | Attending: Internal Medicine | Admitting: Internal Medicine

## 2019-11-05 ENCOUNTER — Other Ambulatory Visit: Payer: Self-pay

## 2019-11-05 DIAGNOSIS — Z8673 Personal history of transient ischemic attack (TIA), and cerebral infarction without residual deficits: Secondary | ICD-10-CM | POA: Insufficient documentation

## 2019-11-05 DIAGNOSIS — I771 Stricture of artery: Secondary | ICD-10-CM | POA: Diagnosis not present

## 2019-11-05 DIAGNOSIS — I6523 Occlusion and stenosis of bilateral carotid arteries: Secondary | ICD-10-CM | POA: Diagnosis not present

## 2019-11-05 DIAGNOSIS — E785 Hyperlipidemia, unspecified: Secondary | ICD-10-CM | POA: Diagnosis not present

## 2019-11-05 DIAGNOSIS — Z720 Tobacco use: Secondary | ICD-10-CM | POA: Diagnosis not present

## 2019-12-09 DIAGNOSIS — Z23 Encounter for immunization: Secondary | ICD-10-CM | POA: Diagnosis not present

## 2019-12-09 DIAGNOSIS — G9001 Carotid sinus syncope: Secondary | ICD-10-CM | POA: Diagnosis not present

## 2019-12-09 DIAGNOSIS — I10 Essential (primary) hypertension: Secondary | ICD-10-CM | POA: Diagnosis not present

## 2020-03-08 DIAGNOSIS — I10 Essential (primary) hypertension: Secondary | ICD-10-CM | POA: Diagnosis not present

## 2020-03-08 DIAGNOSIS — M1990 Unspecified osteoarthritis, unspecified site: Secondary | ICD-10-CM | POA: Diagnosis not present

## 2020-03-08 DIAGNOSIS — R059 Cough, unspecified: Secondary | ICD-10-CM | POA: Diagnosis not present

## 2020-04-12 DIAGNOSIS — M7062 Trochanteric bursitis, left hip: Secondary | ICD-10-CM | POA: Diagnosis not present

## 2020-04-12 DIAGNOSIS — I10 Essential (primary) hypertension: Secondary | ICD-10-CM | POA: Diagnosis not present

## 2020-06-05 DIAGNOSIS — J019 Acute sinusitis, unspecified: Secondary | ICD-10-CM | POA: Diagnosis not present

## 2020-06-05 DIAGNOSIS — B9789 Other viral agents as the cause of diseases classified elsewhere: Secondary | ICD-10-CM | POA: Diagnosis not present

## 2020-06-05 DIAGNOSIS — Z20822 Contact with and (suspected) exposure to covid-19: Secondary | ICD-10-CM | POA: Diagnosis not present

## 2020-06-20 DIAGNOSIS — N390 Urinary tract infection, site not specified: Secondary | ICD-10-CM | POA: Diagnosis not present

## 2020-07-13 DIAGNOSIS — N390 Urinary tract infection, site not specified: Secondary | ICD-10-CM | POA: Diagnosis not present

## 2020-07-13 DIAGNOSIS — I10 Essential (primary) hypertension: Secondary | ICD-10-CM | POA: Diagnosis not present

## 2020-10-19 DIAGNOSIS — I10 Essential (primary) hypertension: Secondary | ICD-10-CM | POA: Diagnosis not present

## 2020-10-19 DIAGNOSIS — R059 Cough, unspecified: Secondary | ICD-10-CM | POA: Diagnosis not present

## 2020-10-19 DIAGNOSIS — Z79899 Other long term (current) drug therapy: Secondary | ICD-10-CM | POA: Diagnosis not present

## 2020-10-19 DIAGNOSIS — R319 Hematuria, unspecified: Secondary | ICD-10-CM | POA: Diagnosis not present

## 2020-11-01 DIAGNOSIS — N281 Cyst of kidney, acquired: Secondary | ICD-10-CM | POA: Diagnosis not present

## 2020-11-01 DIAGNOSIS — R1032 Left lower quadrant pain: Secondary | ICD-10-CM | POA: Diagnosis not present

## 2020-11-01 DIAGNOSIS — R9431 Abnormal electrocardiogram [ECG] [EKG]: Secondary | ICD-10-CM | POA: Diagnosis not present

## 2020-11-01 DIAGNOSIS — R059 Cough, unspecified: Secondary | ICD-10-CM | POA: Diagnosis not present

## 2020-11-01 DIAGNOSIS — R918 Other nonspecific abnormal finding of lung field: Secondary | ICD-10-CM | POA: Diagnosis not present

## 2020-11-01 DIAGNOSIS — K5710 Diverticulosis of small intestine without perforation or abscess without bleeding: Secondary | ICD-10-CM | POA: Diagnosis not present

## 2020-11-01 DIAGNOSIS — K635 Polyp of colon: Secondary | ICD-10-CM | POA: Diagnosis not present

## 2020-11-01 DIAGNOSIS — R109 Unspecified abdominal pain: Secondary | ICD-10-CM | POA: Diagnosis not present

## 2020-11-01 DIAGNOSIS — K6389 Other specified diseases of intestine: Secondary | ICD-10-CM | POA: Diagnosis not present

## 2020-11-01 DIAGNOSIS — R0789 Other chest pain: Secondary | ICD-10-CM | POA: Diagnosis not present

## 2020-11-01 DIAGNOSIS — I700 Atherosclerosis of aorta: Secondary | ICD-10-CM | POA: Diagnosis not present

## 2020-11-01 DIAGNOSIS — R079 Chest pain, unspecified: Secondary | ICD-10-CM | POA: Diagnosis not present

## 2020-11-01 DIAGNOSIS — K449 Diaphragmatic hernia without obstruction or gangrene: Secondary | ICD-10-CM | POA: Diagnosis not present

## 2020-11-01 DIAGNOSIS — K639 Disease of intestine, unspecified: Secondary | ICD-10-CM | POA: Diagnosis not present

## 2020-11-01 DIAGNOSIS — K5730 Diverticulosis of large intestine without perforation or abscess without bleeding: Secondary | ICD-10-CM | POA: Diagnosis not present

## 2020-11-07 DIAGNOSIS — C787 Secondary malignant neoplasm of liver and intrahepatic bile duct: Secondary | ICD-10-CM | POA: Diagnosis not present

## 2020-11-08 ENCOUNTER — Telehealth: Payer: Self-pay | Admitting: Gastroenterology

## 2020-11-08 NOTE — Telephone Encounter (Signed)
Tammy Hawkins, patient is aware. Please put her in the [phone] slot. Thanks!

## 2020-11-08 NOTE — Telephone Encounter (Signed)
Stacey:  I received a call from Dr. Willey Blade yesterday about this nice lady. She is a new patient, no prior GI that I am aware.   CT 11/01/20 in [place] with concern for primary colonic neoplasm and evidence for metastatic disease to liver and lung.   She needs an urgent appointment here. I have a 930 am opening on 9/29. If she is able to come then, please arrange appointment.   IF she is not able to come, we need to see if any other APP has an availability this week or next. Needs to be seen ASAP.   If still no openings, let me know. Will need to discuss with Reba

## 2020-11-09 ENCOUNTER — Encounter: Payer: Self-pay | Admitting: Gastroenterology

## 2020-11-09 ENCOUNTER — Ambulatory Visit (INDEPENDENT_AMBULATORY_CARE_PROVIDER_SITE_OTHER): Payer: Medicare Other | Admitting: Gastroenterology

## 2020-11-09 ENCOUNTER — Telehealth: Payer: Self-pay

## 2020-11-09 ENCOUNTER — Other Ambulatory Visit: Payer: Self-pay

## 2020-11-09 DIAGNOSIS — K6389 Other specified diseases of intestine: Secondary | ICD-10-CM | POA: Diagnosis not present

## 2020-11-09 MED ORDER — ONDANSETRON 4 MG PO TBDP: 4.0000 mg | ORAL_TABLET | Freq: Three times a day (TID) | ORAL | 3 refills | Status: DC | PRN

## 2020-11-09 NOTE — Telephone Encounter (Signed)
Flex Sig instructions given at OV. Tammy Kaufman NP advised pt can take 1 fleet enema to hospital to be given at endo instead of doing enemas at home. Pt and friend Tammy Hawkins) informed. Note made on procedure instructions.

## 2020-11-09 NOTE — Patient Instructions (Addendum)
We are arranging a CT of your chest as soon as possible.  We are also arranging a flexible sigmoidoscopy to take biopsies of the colon mass.   Please go straight to the emergency room if vomiting, unable to tolerate liquids, severe abdominal pain, or worsening abdominal swelling.  I have sent in Zofran to take for nausea every 8 hours as needed.   Further recommendations to follow!  It was a pleasure to see you today. I want to create trusting relationships with patients to provide genuine, compassionate, and quality care. I value your feedback. If you receive a survey regarding your visit,  I greatly appreciate you taking time to fill this out.   Annitta Needs, PhD, ANP-BC [facility]

## 2020-11-09 NOTE — H&P (View-Only) (Signed)
  Primary Care Physician:  Fagan, Roy, MD Referring Physician: Dr. Fagan  Primary Gastroenterologist:  Dr. Carver  Chief Complaint  Patient presents with   colon cancer with metastatic disease to liver and lungs    Per PCP   Abdominal Pain    Right side    HPI:   Tammy Hawkins is an 87 y.o. female presenting today at the request of Dr. Fagan due to CT findings on 11/01/09 of primary colonic neoplasm and metastatic disease to liver and lung. See CT report below. Outside CBC on 9/21 with Hgb 9.9, Hct 30.7, Platelets 385. Mild leukocytosis of 12.2. LFTs with AST 114, Alk Phos 151, Tbili 0.7.    Notes lower extremity edema when starting Tramadol. Went to ED with RUQ pain. Hurting in chest and all over abdomen. Felt like she was about to die. No rectal bleeding. Has been taking laxatives for many years. Chronic history of constipation. When taking taking a laxative, stool comes out soft and mushy. Takes a a laxative about every other day. States just an OTC laxative. No weight loss. Gets full easily. Some nausea recently.   No prior colonoscopy. Friend, Kay Fletcher, is present.   Past Medical History:  Diagnosis Date   High cholesterol     Past Surgical History:  Procedure Laterality Date   CATARACT EXTRACTION      Current Outpatient Medications  Medication Sig Dispense Refill   ALPRAZolam (XANAX) 0.5 MG tablet Take 0.5 mg by mouth 2 (two) times daily.     aspirin EC 81 MG tablet Take 81 mg by mouth daily. Swallow whole.     HYDROcodone-acetaminophen (NORCO/VICODIN) 5-325 MG tablet Take 1 tablet by mouth every 6 (six) hours as needed for moderate pain.     Multiple Vitamin (MULTIVITAMIN) tablet Take 1 tablet by mouth daily.     Omega-3 Fatty Acids (FISH OIL) 1000 MG CAPS Take by mouth daily.     simvastatin (ZOCOR) 40 MG tablet Take 40 mg by mouth daily.     traZODone (DESYREL) 100 MG tablet Take 100 mg by mouth at bedtime.     valsartan (DIOVAN) 80 MG tablet Take 80 mg by  mouth at bedtime.     No current facility-administered medications for this visit.    Allergies as of 11/09/2020   (No Known Allergies)    Family History  Problem Relation Age of Onset   Arthritis Other     Social History   Socioeconomic History   Marital status: Widowed    Spouse name: Not on file   Number of children: Not on file   Years of education: Not on file   Highest education level: Not on file  Occupational History   Not on file  Tobacco Use   Smoking status: Former   Smokeless tobacco: Never  Substance and Sexual Activity   Alcohol use: No   Drug use: No   Sexual activity: Not on file  Other Topics Concern   Not on file  Social History Narrative   Not on file   Social Determinants of Health   Financial Resource Strain: Not on file  Food Insecurity: Not on file  Transportation Needs: Not on file  Physical Activity: Not on file  Stress: Not on file  Social Connections: Not on file  Intimate Partner Violence: Not on file    Review of Systems: As mentioned in HPI  Physical Exam: BP 138/74   Pulse (!) 115   Temp (!) 96.8   F (36 C)   Ht 5' 6" (1.676 m)   Wt 152 lb 3.2 oz (69 kg)   BMI 24.57 kg/m  General:   Alert and oriented. Pleasant and cooperative. Well-nourished and well-developed.  Head:  Normocephalic and atraumatic. Eyes:  Without icterus, sclera clear and conjunctiva pink.  Ears:  Normal auditory acuity. Mouth:  mask in place Lungs:  Clear to auscultation bilaterally. No wheezes, rales, or rhonchi. No distress.  Heart:  S1, S2 present with systolic murmur Abdomen:  +BS, round but soft. Mild TTP RUQ. Small umbilical hernia.  Rectal:  Deferred  Msk:  Symmetrical without gross deformities. Normal posture. Extremities:  With 1+ pedal edema Neurologic:  Alert and  oriented x4;  grossly normal neurologically. Skin:  Intact without significant lesions or rashes. Psych:  Alert and cooperative. Normal mood and affect.   CT abd/pelvis  with contrast on 11/01/20:  CLINICAL DATA:  Pain in the LEFT lower quadrant in a 87-year-old  female.   EXAM:  CT ABDOMEN AND PELVIS WITH CONTRAST   TECHNIQUE:  Multidetector CT imaging of the abdomen and pelvis was performed  using the standard protocol following bolus administration of  intravenous contrast.   CONTRAST:  85 mL Omnipaque 350.   COMPARISON:  Chest x-ray of the same date.   FINDINGS:  Lower chest:   Multiple pulmonary nodules at the lung bases greater than 8 nodules.  Largest nodule in the RIGHT middle lobe (image 5/4) 14 mm.   Largest nodule in the RIGHT lower lobe (image 11/4) 15 mm.   Largest nodule in the LEFT lower lobe 10 mm (image 11/4) no  effusion. No consolidative process. Calcified coronary artery  disease. Normal heart size though heart is incompletely imaged.   Hepatobiliary: Confluent disease in the LEFT hepatic lobe (image  23/2) 7.5 x 5.1 cm.   Confluent disease in the medial segment of the LEFT hepatic lobe and  RIGHT hepatic lobe measuring 8.8 cm greatest dimension.   Numerous additional metastatic foci in the liver. Small amount of  perihepatic ascites. The portal vein remains patent. All areas of  the liver are involved, most severe in the cephalad aspects of RIGHT  and LEFT hemi liver. No substantial gallbladder wall thickening. No  biliary duct distension.   Pancreas: Normal, without mass, inflammation or ductal dilatation.   Spleen: Spleen normal size and contour.   Adrenals/Urinary Tract: Adrenal glands are normal.   Symmetric renal enhancement. No hydronephrosis. No perinephric or  perivesical stranding. Cyst in the LEFT kidney arising medially  measuring 4.5 x 3.8 cm. Duplicated collecting systems and ureters at  least to the proximal to mid ureter on the RIGHT. No hydronephrosis.   Hyperdense cyst versus small solid renal lesion, 11 mm (image 41/2)  35 Hounsfield units.   Stomach/Bowel: Small hiatal hernia. Duodenal  diverticula just before  the ligament of Treitz.   No acute small bowel process.   Apple-core, concentric lesion affecting the descending/sigmoid  junction (image 2)   Colonic diverticulosis.   Vascular/Lymphatic:   Aortic atherosclerosis. No sign of aneurysm. Smooth contour of the  IVC. There is no gastrohepatic or hepatoduodenal ligament  lymphadenopathy. No retroperitoneal or mesenteric lymphadenopathy.   No pelvic sidewall lymphadenopathy.   Reproductive: No adnexal mass. Endometrial thickness at  approximately 11 mm is suggested on CT.   Other: No ascites.  No free air.   Musculoskeletal: No acute or destructive bone finding. Spinal  degenerative changes.  Impression  Concentric thickening of the descending/sigmoid   junction most  suspicious for primary colonic neoplasm.   Signs of hepatic and pulmonary metastatic disease.   Endometrial thickness at approximately 11 mm is suggested on CT.  Follow-up pelvic sonogram may be helpful as warranted for further  evaluation.   Hyperdense cyst versus small solid renal lesion, 11 mm in the LEFT  kidney. CT with and without contrast may be helpful for further  evaluation on follow-up, alternatively comparison with prior imaging  or attention on follow-up may be reasonable given current findings.   Aortic Atherosclerosis (ICD10-I70.0).    ASSESSMENT: Tammy Hawkins is an 87 y.o. female presenting today with no prior history of colonoscopy and now found to have descending/sigmoid colonic mass and metastatic disease to liver and lung.  She has no outright obstructive symptoms and notes a chronic history of constipation, requiring laxatives for many years. No outright rectal bleeding. She is widowed but has good support with her friend, Kay Fletcher, who is present today.   We discussed in depth the findings on imaging. Initially, she did not want to pursue any further diagnostic work-up. However, we discussed tissue biopsy so  that the best route could be discussed for patient moving forward. She does not feel she can tolerate a prep at this point, and I suspect she would not do well with this, either. Flex sig should be able to reach area of concern.   Will arrange flex sig ASAP. I am also referring to Oncology. I have also ordered a CT chest with contrast to be done within next 24 hours for staging purposes.  Discussed signs and symptoms that would necessitate urgent ED evaluation. Both patient and friend state understanding.    PLAN: Flex sig with Dr. Carver in the next week. Risks and benefits discussed in detail. Tap water enemas to be given at hospital as she does not have anyone at home to help her.  CT Chest with contrast on 9/30  Referral to Oncology  Zofran for nausea  To ED if obstructive symptoms  Further recommendations to follow   Makana Rostad W. Annalyn Blecher, PhD, ANP-BC [facility]    

## 2020-11-09 NOTE — Progress Notes (Signed)
Primary Care Physician:  Asencion Noble, MD Referring Physician: Dr. Willey Blade  Primary Gastroenterologist:  Dr. Abbey Chatters  Chief Complaint  Patient presents with   colon cancer with metastatic disease to liver and lungs    Per PCP   Abdominal Pain    Right side    HPI:   Tammy Hawkins is an 87 y.o. female presenting today at the request of Dr. Willey Blade due to CT findings on 11/01/09 of primary colonic neoplasm and metastatic disease to liver and lung. See CT report below. Outside CBC on 9/21 with Hgb 9.9, Hct 30.7, Platelets 385. Mild leukocytosis of 12.2. LFTs with AST 114, Alk Phos 151, Tbili 0.7.    Notes lower extremity edema when starting Tramadol. Went to ED with RUQ pain. Hurting in chest and all over abdomen. Felt like she was about to die. No rectal bleeding. Has been taking laxatives for many years. Chronic history of constipation. When taking taking a laxative, stool comes out soft and mushy. Takes a a laxative about every other day. States just an OTC laxative. No weight loss. Gets full easily. Some nausea recently.   No prior colonoscopy. Tammy Hawkins, is present.   Past Medical History:  Diagnosis Date   High cholesterol     Past Surgical History:  Procedure Laterality Date   CATARACT EXTRACTION      Current Outpatient Medications  Medication Sig Dispense Refill   ALPRAZolam (XANAX) 0.5 MG tablet Take 0.5 mg by mouth 2 (two) times daily.     aspirin EC 81 MG tablet Take 81 mg by mouth daily. Swallow whole.     HYDROcodone-acetaminophen (NORCO/VICODIN) 5-325 MG tablet Take 1 tablet by mouth every 6 (six) hours as needed for moderate pain.     Multiple Vitamin (MULTIVITAMIN) tablet Take 1 tablet by mouth daily.     Omega-3 Fatty Acids (FISH OIL) 1000 MG CAPS Take by mouth daily.     simvastatin (ZOCOR) 40 MG tablet Take 40 mg by mouth daily.     traZODone (DESYREL) 100 MG tablet Take 100 mg by mouth at bedtime.     valsartan (DIOVAN) 80 MG tablet Take 80 mg by  mouth at bedtime.     No current facility-administered medications for this visit.    Allergies as of 11/09/2020   (No Known Allergies)    Family History  Problem Relation Age of Onset   Arthritis Other     Social History   Socioeconomic History   Marital status: Widowed    Spouse name: Not on file   Number of children: Not on file   Years of education: Not on file   Highest education level: Not on file  Occupational History   Not on file  Tobacco Use   Smoking status: Former   Smokeless tobacco: Never  Substance and Sexual Activity   Alcohol use: No   Drug use: No   Sexual activity: Not on file  Other Topics Concern   Not on file  Social History Narrative   Not on file   Social Determinants of Health   Financial Resource Strain: Not on file  Food Insecurity: Not on file  Transportation Needs: Not on file  Physical Activity: Not on file  Stress: Not on file  Social Connections: Not on file  Intimate Partner Violence: Not on file    Review of Systems: As mentioned in HPI  Physical Exam: BP 138/74   Pulse (!) 115   Temp (!) 96.8  F (36 C)   Ht _0  (1.676 m)   Wt 152 lb 3.2 oz (69 kg)   BMI 24.57 kg/m  General:   Alert and oriented. Pleasant and cooperative. Well-nourished and well-developed.  Head:  Normocephalic and atraumatic. Eyes:  Without icterus, sclera clear and conjunctiva pink.  Ears:  Normal auditory acuity. Mouth:  mask in place Lungs:  Clear to auscultation bilaterally. No wheezes, rales, or rhonchi. No distress.  Heart:  S1, S2 present with systolic murmur Abdomen:  +BS, round but soft. Mild TTP RUQ. Small umbilical hernia.  Rectal:  Deferred  Msk:  Symmetrical without gross deformities. Normal posture. Extremities:  With 1+ pedal edema Neurologic:  Alert and  oriented x4;  grossly normal neurologically. Skin:  Intact without significant lesions or rashes. Psych:  Alert and cooperative. Normal mood and affect.   CT abd/pelvis  with contrast on 11/01/20:  CLINICAL DATA:  Pain in the LEFT lower quadrant in a 87 year old  female.   EXAM:  CT ABDOMEN AND PELVIS WITH CONTRAST   TECHNIQUE:  Multidetector CT imaging of the abdomen and pelvis was performed  using the standard protocol following bolus administration of  intravenous contrast.   CONTRAST:  85 mL Omnipaque 350.   COMPARISON:  Chest x-ray of the same date.   FINDINGS:  Lower chest:   Multiple pulmonary nodules at the lung bases greater than 8 nodules.  Largest nodule in the RIGHT middle lobe (image 5/4) 14 mm.   Largest nodule in the RIGHT lower lobe (image 11/4) 15 mm.   Largest nodule in the LEFT lower lobe 10 mm (image 11/4) no  effusion. No consolidative process. Calcified coronary artery  disease. Normal heart size though heart is incompletely imaged.   Hepatobiliary: Confluent disease in the LEFT hepatic lobe (image  23/2) 7.5 x 5.1 cm.   Confluent disease in the medial segment of the LEFT hepatic lobe and  RIGHT hepatic lobe measuring 8.8 cm greatest dimension.   Numerous additional metastatic foci in the liver. Small amount of  perihepatic ascites. The portal vein remains patent. All areas of  the liver are involved, most severe in the cephalad aspects of RIGHT  and LEFT hemi liver. No substantial gallbladder wall thickening. No  biliary duct distension.   Pancreas: Normal, without mass, inflammation or ductal dilatation.   Spleen: Spleen normal size and contour.   Adrenals/Urinary Tract: Adrenal glands are normal.   Symmetric renal enhancement. No hydronephrosis. No perinephric or  perivesical stranding. Cyst in the LEFT kidney arising medially  measuring 4.5 x 3.8 cm. Duplicated collecting systems and ureters at  least to the proximal to mid ureter on the RIGHT. No hydronephrosis.   Hyperdense cyst versus small solid renal lesion, 11 mm (image 41/2)  35 Hounsfield units.   Stomach/Bowel: Small hiatal hernia. Duodenal  diverticula just before  the ligament of Treitz.   No acute small bowel process.   Apple-core, concentric lesion affecting the descending/sigmoid  junction (image 2)   Colonic diverticulosis.   Vascular/Lymphatic:   Aortic atherosclerosis. No sign of aneurysm. Smooth contour of the  IVC. There is no gastrohepatic or hepatoduodenal ligament  lymphadenopathy. No retroperitoneal or mesenteric lymphadenopathy.   No pelvic sidewall lymphadenopathy.   Reproductive: No adnexal mass. Endometrial thickness at  approximately 11 mm is suggested on CT.   Other: No ascites.  No free air.   Musculoskeletal: No acute or destructive bone finding. Spinal  degenerative changes.  Impression  Concentric thickening of the descending/sigmoid  junction most  suspicious for primary colonic neoplasm.   Signs of hepatic and pulmonary metastatic disease.   Endometrial thickness at approximately 11 mm is suggested on CT.  Follow-up pelvic sonogram may be helpful as warranted for further  evaluation.   Hyperdense cyst versus small solid renal lesion, 11 mm in the LEFT  kidney. CT with and without contrast may be helpful for further  evaluation on follow-up, alternatively comparison with prior imaging  or attention on follow-up may be reasonable given current findings.   Aortic Atherosclerosis (ICD10-I70.0).    ASSESSMENT: Tammy Hawkins is an 87 y.o. female presenting today with no prior history of colonoscopy and now found to have descending/sigmoid colonic mass and metastatic disease to liver and lung.  She has no outright obstructive symptoms and notes a chronic history of constipation, requiring laxatives for many years. No outright rectal bleeding. She is widowed but has good support with her friend, Tammy Hawkins, who is present today.   We discussed in depth the findings on imaging. Initially, she did not want to pursue any further diagnostic work-up. However, we discussed tissue biopsy so  that the best route could be discussed for patient moving forward. She does not feel she can tolerate a prep at this point, and I suspect she would not do well with this, either. Flex sig should be able to reach area of concern.   Will arrange flex sig ASAP. I am also referring to Oncology. I have also ordered a CT chest with contrast to be done within next 24 hours for staging purposes.  Discussed signs and symptoms that would necessitate urgent ED evaluation. Both patient and friend state understanding.    PLAN: Flex sig with Dr. Abbey Chatters in the next week. Risks and benefits discussed in detail. Tap water enemas to be given at hospital as she does not have anyone at home to help her.  CT Chest with contrast on 9/30  Referral to Oncology  Zofran for nausea  To ED if obstructive symptoms  Further recommendations to follow   Annitta Needs, PhD, ANP-BC [facility]

## 2020-11-09 NOTE — Telephone Encounter (Signed)
Called and informed friend Zigmund Daniel) of pre-op appt 11/14/20 at 1:45pm.

## 2020-11-09 NOTE — Telephone Encounter (Signed)
PA for flex sig submitted via [facility] website. PA# [id number], valid 11/16/20-02/10/21.

## 2020-11-10 ENCOUNTER — Ambulatory Visit (HOSPITAL_COMMUNITY)
Admission: RE | Admit: 2020-11-10 | Discharge: 2020-11-10 | Disposition: A | Discharge status: Home / Self Care | Payer: Medicare Other | Source: Ambulatory Visit | Attending: Gastroenterology | Admitting: Gastroenterology

## 2020-11-10 DIAGNOSIS — C189 Malignant neoplasm of colon, unspecified: Secondary | ICD-10-CM | POA: Diagnosis not present

## 2020-11-10 DIAGNOSIS — I2510 Atherosclerotic heart disease of native coronary artery without angina pectoris: Secondary | ICD-10-CM | POA: Insufficient documentation

## 2020-11-10 DIAGNOSIS — R918 Other nonspecific abnormal finding of lung field: Secondary | ICD-10-CM | POA: Diagnosis not present

## 2020-11-10 DIAGNOSIS — R911 Solitary pulmonary nodule: Secondary | ICD-10-CM | POA: Diagnosis not present

## 2020-11-10 DIAGNOSIS — K6389 Other specified diseases of intestine: Secondary | ICD-10-CM | POA: Diagnosis not present

## 2020-11-10 DIAGNOSIS — J90 Pleural effusion, not elsewhere classified: Secondary | ICD-10-CM | POA: Insufficient documentation

## 2020-11-10 DIAGNOSIS — E785 Hyperlipidemia, unspecified: Secondary | ICD-10-CM | POA: Diagnosis not present

## 2020-11-10 DIAGNOSIS — I700 Atherosclerosis of aorta: Secondary | ICD-10-CM | POA: Diagnosis not present

## 2020-11-10 DIAGNOSIS — I10 Essential (primary) hypertension: Secondary | ICD-10-CM | POA: Diagnosis not present

## 2020-11-10 DIAGNOSIS — C787 Secondary malignant neoplasm of liver and intrahepatic bile duct: Secondary | ICD-10-CM | POA: Insufficient documentation

## 2020-11-10 DIAGNOSIS — C7800 Secondary malignant neoplasm of unspecified lung: Secondary | ICD-10-CM | POA: Diagnosis not present

## 2020-11-10 DIAGNOSIS — I2690 Septic pulmonary embolism without acute cor pulmonale: Secondary | ICD-10-CM | POA: Diagnosis not present

## 2020-11-10 DIAGNOSIS — I2699 Other pulmonary embolism without acute cor pulmonale: Secondary | ICD-10-CM | POA: Insufficient documentation

## 2020-11-10 MED ORDER — IOHEXOL 350 MG/ML SOLN
60.0000 mL | Freq: Once | INTRAVENOUS | Status: AC | PRN
  Administered 2020-11-10: 60 mL via INTRAVENOUS

## 2020-11-11 ENCOUNTER — Telehealth (INDEPENDENT_AMBULATORY_CARE_PROVIDER_SITE_OTHER): Payer: Self-pay | Admitting: Internal Medicine

## 2020-11-11 NOTE — Telephone Encounter (Signed)
I received call from Dr. Lanae Crumbly. [place] regarding CT results on Ms. Dibello.  CT was ordered by Ms. Roseanne Kaufman.  He reported pulmonary embolus.   I contacted Dr. Willey Blade who is patient's primary care physician and knows her well.   He started patient on anticoagulant and we decided to postpone flexible sigmoidoscopy which is scheduled for 11/16/2020 with Dr. Abbey Chatters to evaluate sigmoid colon mass discovered on recent CT and she also has evidence of metastatic disease.

## 2020-11-13 ENCOUNTER — Telehealth: Payer: Self-pay | Admitting: Internal Medicine

## 2020-11-13 NOTE — Telephone Encounter (Signed)
Returned the pt's call and advised of all the appts up to date of flex-sig, instructions and where to go/time. Keep pre-op appt tomorrow they will find out more to any questions they may have. They had no questions for today

## 2020-11-13 NOTE — Telephone Encounter (Signed)
Dena:  I spoke with Dr. Abbey Chatters. We are going to keep her on for pre-op tomorrow. Tentatively planning for flex sig ON anticoagulation on Thursday. Keep appt with Dr. Delton Coombes on Wednesday. I am messaging Dr. Raliegh Ip to update [place] if anything changes after he has seen her that day. Please let Zigmund Daniel (cell [phone]) know. Let me know if any questions.  Vicente Males

## 2020-11-13 NOTE — Telephone Encounter (Signed)
Pt's friend, Adora Fridge, came to front office window wanting to speak to someone about what the patient needed to do. She had called an hour ago and phone note is being addressed by the nurse and Roseanne Kaufman, NP. I told her the nurse was aware, but she was at lunch and would be back in about and someone would call her. She is very frustrated and wants to know something today.

## 2020-11-13 NOTE — Telephone Encounter (Signed)
See other phone note

## 2020-11-13 NOTE — Telephone Encounter (Signed)
Please call patient at [phone]. Family member called saying they had talked with Roseanne Kaufman, NP over the weekend and has questions about what is being ordered and what needs to be cancelled. She is aware of her pre op and procedure date and also aware of appt with cancer doctor.

## 2020-11-13 NOTE — Telephone Encounter (Signed)
error 

## 2020-11-13 NOTE — Patient Instructions (Signed)
Tammy Hawkins  11/13/2020     @PREFPERIOPPHARMACY @   Your procedure is scheduled on  11/16/2020.   Report to [facility] at   0700 A.M.   Call this number if you have problems the morning of surgery:  [phone]   Remember:  Follow the diet and prep instructions given to you by the office.    Take these medicines the morning of surgery with A SIP OF WATER       xanax(if needed), hydrocodone (if needed), zofran (if needed).     Do not wear jewelry, make-up or nail polish.  Do not wear lotions, powders, or perfumes, or deodorant.  Do not shave 48 hours prior to surgery.  Men may shave face and neck.  Do not bring valuables to the hospital.  [facility] is not responsible for any belongings or valuables.  Contacts, dentures or bridgework may not be worn into surgery.  Leave your suitcase in the car.  After surgery it may be brought to your room.  For patients admitted to the hospital, discharge time will be determined by your treatment team.  Patients discharged the day of surgery will not be allowed to drive home and must have someone with them for 24 hours.    Special instructions:   DO NOT smoke tobacco or vape for 24 hours before your procedure.  Please read over the following fact sheets that you were given. Anesthesia Post-op Instructions and Care and Recovery After Surgery      Flexible Sigmoidoscopy, Care After This sheet gives you information about how to care for yourself after your procedure. Your health care provider may also give you more specific instructions. If you have problems or questions, contact your health care provider. What can I expect after the procedure? After the procedure, it is common to have: Cramping or pain in your abdomen. Bloating. A small amount of blood with your bowel movements. This may happen if a sample of tissue was removed for testing (biopsy). Follow these instructions at home: Eating and drinking  Drink enough  fluid to keep your urine pale yellow. Follow instructions from your health care provider about eating or drinking restrictions. Resume your normal diet as instructed by your health care provider. Avoid heavy or fried foods that are hard to digest. Activity  If you were given a medicine to help you relax (sedative) during the procedure, it can affect you for several hours. Do not drive or operate machinery until your health care provider says that it is safe. Rest as told by your health care provider. Return to your normal activities as told by your health care provider. Ask your health care provider what activities are safe for you. General instructions Take over-the-counter and prescription medicines only as told by your health care provider. Try walking around when you have cramps or feel bloated. Keep all follow-up visits as told by your health care provider. This is important. Contact a health care provider if: You have pain or cramping in your abdomen that gets worse or is not helped with medicine. You have a small amount of bleeding from your rectum that continues after 24 hours. You have nausea or vomiting. You feel weak or dizzy. You develop a fever. Get help right away if: You pass large blood clots or see a large amount of blood in the toilet after having a bowel movement. You have severe pain in your abdomen. You have nausea or vomiting for  more than 24 hours after the procedure. Summary After the procedure, you may have cramping or pain in your abdomen or you may have bloating. If you had a sample of tissue removed (biopsy), you may have a small amount of blood with your bowel movements. Resume your normal diet as instructed by your health care provider. Avoid heavy or fried foods that are hard to digest. Try walking around when you have cramps or feel bloated. Get help right away if you pass large blood clots or see a large amount of blood in the toilet after having a bowel  movement. This information is not intended to replace advice given to you by your health care provider. Make sure you discuss any questions you have with your health care provider. Document Revised: 01/25/2019 Document Reviewed: 01/25/2019 Elsevier Patient Education  2022 [place] After This sheet gives you information about how to care for yourself after your procedure. Your health care provider may also give you more specific instructions. If you have problems or questions, contact your health care provider. What can I expect after the procedure? After the procedure, it is common to have: Tiredness. Forgetfulness about what happened after the procedure. Impaired judgment for important decisions. Nausea or vomiting. Some difficulty with balance. Follow these instructions at home: For the time period you were told by your health care provider:   Rest as needed. Do not participate in activities where you could fall or become injured. Do not drive or use machinery. Do not drink alcohol. Do not take sleeping pills or medicines that cause drowsiness. Do not make important decisions or sign legal documents. Do not take care of children on your own. Eating and drinking Follow the diet that is recommended by your health care provider. Drink enough fluid to keep your urine pale yellow. If you vomit: Drink water, juice, or soup when you can drink without vomiting. Make sure you have little or no nausea before eating solid foods. General instructions Have a responsible adult stay with you for the time you are told. It is important to have someone help care for you until you are awake and alert. Take over-the-counter and prescription medicines only as told by your health care provider. If you have sleep apnea, surgery and certain medicines can increase your risk for breathing problems. Follow instructions from your health care provider about wearing your  sleep device: Anytime you are sleeping, including during daytime naps. While taking prescription pain medicines, sleeping medicines, or medicines that make you drowsy. Avoid smoking. Keep all follow-up visits as told by your health care provider. This is important. Contact a health care provider if: You keep feeling nauseous or you keep vomiting. You feel light-headed. You are still sleepy or having trouble with balance after 24 hours. You develop a rash. You have a fever. You have redness or swelling around the IV site. Get help right away if: You have trouble breathing. You have new-onset confusion at home. Summary For several hours after your procedure, you may feel tired. You may also be forgetful and have poor judgment. Have a responsible adult stay with you for the time you are told. It is important to have someone help care for you until you are awake and alert. Rest as told. Do not drive or operate machinery. Do not drink alcohol or take sleeping pills. Get help right away if you have trouble breathing, or if you suddenly become confused. This information is not intended to  replace advice given to you by your health care provider. Make sure you discuss any questions you have with your health care provider. Document Revised: 10/14/2019 Document Reviewed: 12/31/2018 Elsevier Patient Education  2022 [employer].

## 2020-11-13 NOTE — Telephone Encounter (Signed)
It is under result notes. I have to talk to Dr. Abbey Chatters. I will have an update for them this afternoon. I haven't been able to review this with him yet. For now, may not need pre-op appt tomorrow. Will let them know ASAP.

## 2020-11-14 ENCOUNTER — Encounter (HOSPITAL_COMMUNITY)
Admission: RE | Admit: 2020-11-14 | Discharge: 2020-11-14 | Disposition: A | Discharge status: Home / Self Care | Payer: Medicare Other | Source: Ambulatory Visit | Attending: Internal Medicine | Admitting: Internal Medicine

## 2020-11-14 ENCOUNTER — Encounter (HOSPITAL_COMMUNITY): Payer: Self-pay

## 2020-11-14 DIAGNOSIS — Z01810 Encounter for preprocedural cardiovascular examination: Secondary | ICD-10-CM | POA: Insufficient documentation

## 2020-11-14 HISTORY — DX: Other specified postprocedural states: Z98.890

## 2020-11-14 HISTORY — DX: Other specified postprocedural states: R11.2

## 2020-11-14 NOTE — Progress Notes (Signed)
[place] [address], [place] [address]   CLINIC:  Medical Oncology/Hematology  CONSULT NOTE  Patient Care Team: Asencion Noble, MD as PCP - General (Internal Medicine) Brien Mates, RN as Oncology Nurse Navigator (Oncology) Derek Jack, MD as Medical Oncologist (Oncology)  CHIEF COMPLAINTS/PURPOSE OF CONSULTATION:  Evaluation of colonic mass with liver and lung mets  HISTORY OF PRESENTING ILLNESS:  Ms. Tammy Hawkins 87 y.o. female is here because of evaluation of colonic mass, at the request of [facility].  Today she reports feeling weak. She presented to [facility] hospital for abdominal pain that has been present for 2 months. She reports fatigue beginning 2 months ago which has prevented her from doing many of her typical home activities. She reports leg and feet swelling, poor appetite, dizziness, and constipation which has been present prior to the abdominal pain and fatigue. She had intermittent CP that does not worsen upon deep inhalations or exhalations, and occasional numbness in her hands and feet. She denies weight loss, hematuria, hematochezia, and black stools. She spends most of the day sitting, but she tries to walk within the house; fatigue prevents her from becoming more active. She is not currently drinking Boost/Ensure consistently. She lives at home alone, and her grandchildren live locally. 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 She reports a history of smoking, and she quit in 1987. Her sister had an unspecified cancer in the lymph nodes, and her brother had an unspecified cancer.   MEDICAL HISTORY:  Past Medical History:  Diagnosis Date   High cholesterol    HTN (hypertension)    PONV (postoperative nausea and vomiting)     SURGICAL HISTORY: Past Surgical History:  Procedure Laterality Date   CATARACT EXTRACTION      SOCIAL HISTORY: Social History   Socioeconomic History   Marital status: Widowed     Spouse name: Not on file   Number of children: Not on file   Years of education: Not on file   Highest education level: Not on file  Occupational History   Not on file  Tobacco Use   Smoking status: Former    Types: Cigarettes    Quit date: 31    Years since quitting: 35.7   Smokeless tobacco: Never  Vaping Use   Vaping Use: Never used  Substance and Sexual Activity   Alcohol use: No   Drug use: No   Sexual activity: Not Currently  Other Topics Concern   Not on file  Social History Narrative   Not on file   Social Determinants of Health   Financial Resource Strain: Not on file  Food Insecurity: Not on file  Transportation Needs: Not on file  Physical Activity: Not on file  Stress: Not on file  Social Connections: Not on file  Intimate Partner Violence: Not on file    FAMILY HISTORY: Family History  Problem Relation Age of Onset   Arthritis Other    Colon cancer Neg Hx     ALLERGIES:  has No Known Allergies.  MEDICATIONS:  Current Outpatient Medications  Medication Sig Dispense Refill   ALPRAZolam (XANAX) 0.5 MG tablet Take 0.5 mg by mouth 2 (two) times daily as needed for anxiety.     benzonatate (TESSALON) 100 MG capsule Take 100 mg by mouth 3 (three) times daily as needed for cough.     HYDROcodone-acetaminophen (NORCO/VICODIN) 5-325 MG tablet Take 1 tablet by mouth every 6 (six) hours as needed for moderate  pain.     hydroxypropyl methylcellulose / hypromellose (ISOPTO TEARS / GONIOVISC) 2.5 % ophthalmic solution Place 1 drop into both eyes daily as needed for dry eyes.     Multiple Vitamin (MULTIVITAMIN) tablet Take 1 tablet by mouth daily.     Omega-3 Fatty Acids (FISH OIL) 1000 MG CAPS Take 1,000 mg by mouth daily.     ondansetron (ZOFRAN ODT) 4 MG disintegrating tablet Take 1 tablet (4 mg total) by mouth every 8 (eight) hours as needed for nausea or vomiting. 30 tablet 3   Rivaroxaban (XARELTO) 15 MG TABS tablet Take 15 mg by mouth 2 (two) times daily  with a meal.     simvastatin (ZOCOR) 40 MG tablet Take 40 mg by mouth daily.     traZODone (DESYREL) 100 MG tablet Take 100 mg by mouth at bedtime.     valsartan (DIOVAN) 80 MG tablet Take 80 mg by mouth at bedtime.     Current Facility-Administered Medications  Medication Dose Route Frequency Provider Last Rate Last Admin   influenza vaccine adjuvanted (FLUAD) injection 0.5 mL  0.5 mL Intramuscular Once Derek Jack, MD        REVIEW OF SYSTEMS:   Review of Systems  Constitutional:  Positive for appetite change (40%) and fatigue (depleted). Negative for unexpected weight change.  Respiratory:  Positive for shortness of breath.   Cardiovascular:  Positive for chest pain and leg swelling.  Gastrointestinal:  Positive for abdominal pain (9/10 R side), constipation and nausea. Negative for blood in stool.  Genitourinary:  Negative for hematuria.   Neurological:  Positive for dizziness (vertigo) and numbness.  Psychiatric/Behavioral:  Positive for depression and sleep disturbance.   All other systems reviewed and are negative.   PHYSICAL EXAMINATION: ECOG PERFORMANCE STATUS: 2 - Symptomatic, <50% confined to bed  Vitals:   11/15/20 0811  BP: 136/82  Pulse: (!) 104  Resp: 18  Temp: 100 F (37.8 C)  SpO2: 98%   Filed Weights   11/15/20 0811  Weight: 154 lb 12.8 oz (70.2 kg)   Physical Exam Vitals reviewed.  Constitutional:      Appearance: Normal appearance.     Comments: In wheelchair  Cardiovascular:     Rate and Rhythm: Normal rate and regular rhythm.     Pulses: Normal pulses.     Heart sounds: Normal heart sounds.  Pulmonary:     Effort: Pulmonary effort is normal.     Breath sounds: Normal breath sounds.  Abdominal:     Palpations: Abdomen is soft. There is no hepatomegaly, splenomegaly or mass.     Tenderness: There is abdominal tenderness in the right upper quadrant.  Musculoskeletal:     Right lower leg: 1+ Edema present.     Left lower leg: 1+ Edema  present.  Lymphadenopathy:     Cervical: No cervical adenopathy.     Right cervical: No superficial cervical adenopathy.    Left cervical: No superficial cervical adenopathy.     Upper Body:     Right upper body: No supraclavicular, axillary or pectoral adenopathy.     Left upper body: No supraclavicular, axillary or pectoral adenopathy.  Neurological:     General: No focal deficit present.     Mental Status: She is alert and oriented to person, place, and time.  Psychiatric:        Mood and Affect: Mood normal.        Behavior: Behavior normal.     LABORATORY DATA:  I have  reviewed the data as listed CBC Latest Ref Rng & Units 11/15/2020 08/01/2010 05/10/2010  WBC 4.0 - 10.5 K/uL 14.9(H) - -  Hemoglobin 12.0 - 15.0 g/dL 10.0(L) 12.7 12.9  Hematocrit 36.0 - 46.0 % 32.0(L) 37.8 38.2  Platelets 150 - 400 K/uL 466(H) - -   CMP Latest Ref Rng & Units 11/15/2020 08/01/2010 05/10/2010  Glucose 70 - 99 mg/dL 122(H) 106(H) 120(H)  BUN 8 - 23 mg/dL _0 Creatinine 0.44 - 1.00 mg/dL 0.75 0.67 0.62  Sodium 135 - 145 mmol/L 132(L) 139 138  Potassium 3.5 - 5.1 mmol/L 3.6 4.2 4.0  Chloride 98 - 111 mmol/L 95(L) 102 101  CO2 22 - 32 mmol/L _1 Calcium 8.9 - 10.3 mg/dL 8.4(L) 9.9 9.6  Total Protein 6.5 - 8.1 g/dL 7.5 - -  Total Bilirubin 0.3 - 1.2 mg/dL 0.9 - -  Alkaline Phos 38 - 126 U/L 171(H) - -  AST 15 - 41 U/L 71(H) - -  ALT 0 - 44 U/L 19 - -    RADIOGRAPHIC STUDIES: I have personally reviewed the radiological images as listed and agreed with the findings in the report. CT CHEST W CONTRAST  Result Date: 11/10/2020 CLINICAL DATA:  New diagnosis colon cancer, chest staging EXAM: CT CHEST WITH CONTRAST TECHNIQUE: Multidetector CT imaging of the chest was performed during intravenous contrast administration. CONTRAST:  58m OMNIPAQUE IOHEXOL 350 MG/ML SOLN COMPARISON:  CT abdomen pelvis, 11/01/2020 FINDINGS: Cardiovascular: Aortic atherosclerosis. Normal heart size. Three-vessel  coronary artery calcifications. No pericardial effusion. Incidental note of lobar embolus present in the right lower lobe (series 2, image 82). Mediastinum/Nodes: No enlarged mediastinal, hilar, or axillary lymph nodes. Thyroid gland, trachea, and esophagus demonstrate no significant findings. Lungs/Pleura: Trace bilateral pleural effusions. Numerous small pulmonary nodules throughout the lungs, index nodule of the lateral segment right middle lobe measuring 1.6 x 1.2 cm (series 4, image 88), index nodule of the peripheral left upper lobe measuring 1.3 x 1.1 cm (series 4, image 70). Upper Abdomen: No acute abnormality. Innumerable hypodense metastatic lesions throughout the included liver. Musculoskeletal: No chest wall mass or suspicious bone lesions identified. IMPRESSION: 1. Numerous small pulmonary nodules throughout the lungs, consistent with metastatic disease. 2. Innumerable hypodense metastatic lesions throughout the included liver, better assessed by prior CT of the abdomen and pelvis. 3. Incidental note of lobar pulmonary embolus present in the right lower lobe. 4. Trace bilateral pleural effusions. 5. Coronary artery disease. Call report request was placed at the time of interpretation. Final communication will be documented. Aortic Atherosclerosis (ICD10-I70.0). Electronically Signed   By: ADelanna AhmadiM.D.   On: 11/10/2020 14:41    ASSESSMENT:  Highly suspicious for metastatic sigmoid colon cancer to the liver and lungs: - Presentation with lower abdominal pain for the past 2 months with worsening weakness [facility]/sigmoid junction suspicious for colonic neoplasm, metastatic disease in both lobes of the lung, largest in the right hepatic lobe measuring 8.8 cm.  No adenopathy. - CT chest with contrast on 11/10/2020 with numerous small pulmonary nodules throughout the lungs consistent with metastatic disease.   Incidental lobar pulmonary embolus present in the right lower lobe. - She never had a colonoscopy.  She has been having constipation for more than a year.  Denies any bleeding per rectum or melena.  2.  Social/family history: - She lives by herself.  Her friend KZigmund Danielis staying with her  for the past 2 weeks as she is feeling weak.  She has grandchildren who live locally.  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  Quit smoking in 1987. - Sister had "lymph node cancer".  Brother had cancer, type unknown to the patient.   PLAN:  Metastatic sigmoid colon cancer to the liver and lungs: - We reviewed findings on the imaging from [facility] as well as CT scan done here. - She appears very weak and frail.  She does not have a tissue diagnosis yet. - She is having sigmoidoscopy and biopsy tomorrow. - Based on her performance status, she is unlikely to be a chemotherapy candidate.  Recommend guardant 360 to check for MSI status.  If MSI high, she may benefit from immunotherapy single agent. - We will obtain CEA, CBC, CMP, ferritin and iron panel today. - RTC 1 week for follow-up.  If functional status continues to decline, will recommend best supportive care in the form of hospice.  2.  Pulmonary embolism: - Incidental finding on CT chest from 11/10/2020. - Continue Xarelto.  No bleeding issues reported.  3.  Nutrition: - She reports loss of appetite and decreased eating. - She was told to drink at least 3 to 5 cans of boost every day.     All questions were answered. The patient knows to call the clinic with any problems, questions or concerns.   Derek Jack, MD, 11/15/20 12:57 PM  [place] [phone]   I, Thana Ates, am acting as a scribe for Dr. Derek Jack.  I, Derek Jack MD, have reviewed the above documentation for accuracy and completeness, and I agree with the above.

## 2020-11-15 ENCOUNTER — Other Ambulatory Visit: Payer: Self-pay

## 2020-11-15 ENCOUNTER — Telehealth: Payer: Self-pay | Admitting: Gastroenterology

## 2020-11-15 ENCOUNTER — Encounter (HOSPITAL_COMMUNITY): Payer: Self-pay | Admitting: Hematology

## 2020-11-15 ENCOUNTER — Inpatient Hospital Stay (HOSPITAL_COMMUNITY): Payer: Medicare Other

## 2020-11-15 ENCOUNTER — Inpatient Hospital Stay (HOSPITAL_COMMUNITY): Payer: Medicare Other | Attending: Hematology | Admitting: Hematology

## 2020-11-15 VITALS — BP 136/82 | HR 104 | Temp 100.0°F | Resp 18 | Wt 154.8 lb

## 2020-11-15 DIAGNOSIS — Z79899 Other long term (current) drug therapy: Secondary | ICD-10-CM | POA: Diagnosis not present

## 2020-11-15 DIAGNOSIS — Z7901 Long term (current) use of anticoagulants: Secondary | ICD-10-CM | POA: Insufficient documentation

## 2020-11-15 DIAGNOSIS — I2699 Other pulmonary embolism without acute cor pulmonale: Secondary | ICD-10-CM | POA: Diagnosis not present

## 2020-11-15 DIAGNOSIS — C787 Secondary malignant neoplasm of liver and intrahepatic bile duct: Secondary | ICD-10-CM | POA: Insufficient documentation

## 2020-11-15 DIAGNOSIS — Z23 Encounter for immunization: Secondary | ICD-10-CM

## 2020-11-15 DIAGNOSIS — I2510 Atherosclerotic heart disease of native coronary artery without angina pectoris: Secondary | ICD-10-CM | POA: Insufficient documentation

## 2020-11-15 DIAGNOSIS — I10 Essential (primary) hypertension: Secondary | ICD-10-CM | POA: Insufficient documentation

## 2020-11-15 DIAGNOSIS — Z87891 Personal history of nicotine dependence: Secondary | ICD-10-CM | POA: Insufficient documentation

## 2020-11-15 DIAGNOSIS — C187 Malignant neoplasm of sigmoid colon: Secondary | ICD-10-CM | POA: Insufficient documentation

## 2020-11-15 DIAGNOSIS — C189 Malignant neoplasm of colon, unspecified: Secondary | ICD-10-CM | POA: Insufficient documentation

## 2020-11-15 DIAGNOSIS — E7800 Pure hypercholesterolemia, unspecified: Secondary | ICD-10-CM | POA: Diagnosis not present

## 2020-11-15 LAB — MAGNESIUM: Magnesium: 2 mg/dL (ref 1.7–2.4)

## 2020-11-15 LAB — COMPREHENSIVE METABOLIC PANEL
ALT: 19 U/L (ref 0–44)
AST: 71 U/L — ABNORMAL HIGH (ref 15–41)
Albumin: 2.4 g/dL — ABNORMAL LOW (ref 3.5–5.0)
Alkaline Phosphatase: 171 U/L — ABNORMAL HIGH (ref 38–126)
Anion gap: 12 (ref 5–15)
BUN: 17 mg/dL (ref 8–23)
CO2: 25 mmol/L (ref 22–32)
Calcium: 8.4 mg/dL — ABNORMAL LOW (ref 8.9–10.3)
Chloride: 95 mmol/L — ABNORMAL LOW (ref 98–111)
Creatinine, Ser: 0.75 mg/dL (ref 0.44–1.00)
GFR, Estimated: >60 mL/min (ref >60)
Glucose, Bld: 122 mg/dL — ABNORMAL HIGH (ref 70–99)
Potassium: 3.6 mmol/L (ref 3.5–5.1)
Sodium: 132 mmol/L — ABNORMAL LOW (ref 135–145)
Total Bilirubin: 0.9 mg/dL (ref 0.3–1.2)
Total Protein: 7.5 g/dL (ref 6.5–8.1)

## 2020-11-15 LAB — CBC WITH DIFFERENTIAL/PLATELET
Abs Immature Granulocytes: 0.09 10*3/uL — ABNORMAL HIGH (ref 0.00–0.07)
Basophils Absolute: 0.1 10*3/uL (ref 0.0–0.1)
Basophils Relative: 0 %
Eosinophils Absolute: 0 10*3/uL (ref 0.0–0.5)
Eosinophils Relative: 0 %
HCT: 32 % — ABNORMAL LOW (ref 36.0–46.0)
Hemoglobin: 10 g/dL — ABNORMAL LOW (ref 12.0–15.0)
Immature Granulocytes: 1 %
Lymphocytes Relative: 4 %
Lymphs Abs: 0.5 10*3/uL — ABNORMAL LOW (ref 0.7–4.0)
MCH: 27.1 pg (ref 26.0–34.0)
MCHC: 31.3 g/dL (ref 30.0–36.0)
MCV: 86.7 fL (ref 80.0–100.0)
Monocytes Absolute: 0.8 10*3/uL (ref 0.1–1.0)
Monocytes Relative: 6 %
Neutro Abs: 13.4 10*3/uL — ABNORMAL HIGH (ref 1.7–7.7)
Neutrophils Relative %: 89 %
Platelets: 466 10*3/uL — ABNORMAL HIGH (ref 150–400)
RBC: 3.69 MIL/uL — ABNORMAL LOW (ref 3.87–5.11)
RDW: 16.2 % — ABNORMAL HIGH (ref 11.5–15.5)
WBC: 14.9 10*3/uL — ABNORMAL HIGH (ref 4.0–10.5)
nRBC: 0 % (ref 0.0–0.2)

## 2020-11-15 LAB — IRON AND TIBC
Iron: 12 ug/dL — ABNORMAL LOW (ref 28–170)
Saturation Ratios: 9 % — ABNORMAL LOW (ref 10.4–31.8)
TIBC: 141 ug/dL — ABNORMAL LOW (ref 250–450)
UIBC: 129 ug/dL

## 2020-11-15 LAB — FERRITIN: Ferritin: 548 ng/mL — ABNORMAL HIGH (ref 11–307)

## 2020-11-15 LAB — FOLATE: Folate: 16 ng/mL (ref >5.9)

## 2020-11-15 LAB — VITAMIN B12: Vitamin B-12: 1546 pg/mL — ABNORMAL HIGH (ref 180–914)

## 2020-11-15 MED ORDER — INFLUENZA VAC A&B SA ADJ QUAD 0.5 ML IM PRSY: 0.5000 mL | PREFILLED_SYRINGE | Freq: Once | INTRAMUSCULAR | Status: DC

## 2020-11-15 NOTE — Patient Instructions (Addendum)
[place] at [facility] Discharge Instructions  You were seen and examined today by Dr. Delton Coombes. Dr. Delton Coombes is a medical oncologist, meaning he specializes in the management of cancer diagnoses. Dr. Delton Coombes discussed your past medical history, family history of cancer, and the events that led to you being here today.  On your recent CT scans, there were multiple areas of concern. It is concerning for colon cancer that has spread to your liver and lungs, meaning that it is Stage IV and cannot be cured, but it can be treated with chemotherapy. The goal of treatment would be to control the spread of cancer and decrease the symptoms that you are currently experiencing due to the cancer.  Dr. Delton Coombes recommended proceeding with the Sigmoidoscopy as scheduled tomorrow. Dr. Abbey Chatters will take a biopsy of the area to confirm diagnosis. This would be necessary prior to beginning any treatment.  Please increase your nutritional intake as best you can. You should be drinking about 5 cans of Boost Plus daily to replace your meals.  Follow-up with Dr. Delton Coombes as scheduled.   Thank you for choosing [place] at [facility] to provide your oncology and hematology care.  To afford each patient quality time with our provider, please arrive at least 15 minutes before your scheduled appointment time.   If you have a lab appointment with the [facility] please come in thru the Main Entrance and check in at the main information desk.  You need to re-schedule your appointment should you arrive 10 or more minutes late.  We strive to give you quality time with our providers, and arriving late affects you and other patients whose appointments are after yours.  Also, if you no show three or more times for appointments you may be dismissed from the clinic at the providers discretion.     Again, thank you for choosing [facility].  Our hope is that  these requests will decrease the amount of time that you wait before being seen by our physicians.       _____________________________________________________________  Should you have questions after your visit to [facility], please contact our office at [phone] and follow the prompts.  Our office hours are 8:00 a.m. and 4:30 p.m. Monday - Friday.  Please note that voicemails left after 4:00 p.m. may not be returned until the following business day.  We are closed weekends and major holidays.  You do have access to a nurse 24-7, just call the main number to the clinic [phone] and do not press any options, hold on the line and a nurse will answer the phone.    For prescription refill requests, have your pharmacy contact our office and allow 72 hours.    Due to Covid, you will need to wear a mask upon entering the hospital. If you do not have a mask, a mask will be given to you at the Main Entrance upon arrival. For doctor visits, patients may have 1 support person age 23 or older with them. For treatment visits, patients can not have anyone with them due to social distancing guidelines and our immunocompromised population.

## 2020-11-15 NOTE — Telephone Encounter (Signed)
Per Dr. Abbey Chatters, patient requested to hold Xarelto's morning dose tomorrow, 10/6. I notified patient's caregiver with stated understanding.  Annitta Needs, PhD, ANP-BC [facility]

## 2020-11-16 ENCOUNTER — Ambulatory Visit (HOSPITAL_COMMUNITY): Payer: Medicare Other | Admitting: Anesthesiology

## 2020-11-16 ENCOUNTER — Encounter (HOSPITAL_COMMUNITY)
Admission: RE | Disposition: A | Discharge status: Home / Self Care | Payer: Self-pay | Source: Home / Self Care | Attending: Internal Medicine

## 2020-11-16 ENCOUNTER — Encounter (HOSPITAL_COMMUNITY): Payer: Self-pay

## 2020-11-16 ENCOUNTER — Ambulatory Visit (HOSPITAL_COMMUNITY)
Admission: RE | Admit: 2020-11-16 | Discharge: 2020-11-16 | Disposition: A | Discharge status: Home / Self Care | Payer: Medicare Other | Attending: Internal Medicine | Admitting: Internal Medicine

## 2020-11-16 DIAGNOSIS — R933 Abnormal findings on diagnostic imaging of other parts of digestive tract: Secondary | ICD-10-CM | POA: Diagnosis not present

## 2020-11-16 DIAGNOSIS — C787 Secondary malignant neoplasm of liver and intrahepatic bile duct: Secondary | ICD-10-CM | POA: Insufficient documentation

## 2020-11-16 DIAGNOSIS — K5730 Diverticulosis of large intestine without perforation or abscess without bleeding: Secondary | ICD-10-CM | POA: Insufficient documentation

## 2020-11-16 DIAGNOSIS — Z87891 Personal history of nicotine dependence: Secondary | ICD-10-CM | POA: Insufficient documentation

## 2020-11-16 DIAGNOSIS — C187 Malignant neoplasm of sigmoid colon: Secondary | ICD-10-CM | POA: Diagnosis not present

## 2020-11-16 DIAGNOSIS — D490 Neoplasm of unspecified behavior of digestive system: Secondary | ICD-10-CM

## 2020-11-16 DIAGNOSIS — C7802 Secondary malignant neoplasm of left lung: Secondary | ICD-10-CM | POA: Diagnosis not present

## 2020-11-16 DIAGNOSIS — C7801 Secondary malignant neoplasm of right lung: Secondary | ICD-10-CM | POA: Diagnosis not present

## 2020-11-16 DIAGNOSIS — K6389 Other specified diseases of intestine: Secondary | ICD-10-CM | POA: Diagnosis not present

## 2020-11-16 DIAGNOSIS — Z7982 Long term (current) use of aspirin: Secondary | ICD-10-CM | POA: Insufficient documentation

## 2020-11-16 DIAGNOSIS — Z79899 Other long term (current) drug therapy: Secondary | ICD-10-CM | POA: Diagnosis not present

## 2020-11-16 HISTORY — PX: BIOPSY: SHX5522

## 2020-11-16 HISTORY — PX: SUBMUCOSAL TATTOO INJECTION: SHX6856

## 2020-11-16 HISTORY — PX: FLEXIBLE SIGMOIDOSCOPY: SHX5431

## 2020-11-16 LAB — CEA: CEA: 70627 ng/mL — ABNORMAL HIGH (ref 0.0–4.7)

## 2020-11-16 SURGERY — SIGMOIDOSCOPY, FLEXIBLE
Anesthesia: General

## 2020-11-16 MED ORDER — MIDAZOLAM HCL 2 MG/2ML IJ SOLN
INTRAMUSCULAR | Status: AC
  Filled 2020-11-16: qty 2

## 2020-11-16 MED ORDER — PROPOFOL 10 MG/ML IV BOLUS
INTRAVENOUS | Status: AC
  Filled 2020-11-16: qty 60

## 2020-11-16 MED ORDER — STERILE WATER FOR IRRIGATION IR SOLN
Status: DC | PRN
  Administered 2020-11-16: 200 mL

## 2020-11-16 MED ORDER — SPOT INK MARKER SYRINGE KIT
PACK | SUBMUCOSAL | Status: DC | PRN
  Administered 2020-11-16: 1 mL via SUBMUCOSAL

## 2020-11-16 MED ORDER — SPOT INK MARKER SYRINGE KIT
PACK | SUBMUCOSAL | Status: AC
  Filled 2020-11-16: qty 5

## 2020-11-16 MED ORDER — PROPOFOL 10 MG/ML IV BOLUS
INTRAVENOUS | Status: DC | PRN
  Administered 2020-11-16: 30 mg via INTRAVENOUS
  Administered 2020-11-16: 20 mg via INTRAVENOUS

## 2020-11-16 MED ORDER — LACTATED RINGERS IV SOLN
INTRAVENOUS | Status: DC
  Administered 2020-11-16: 1000 mL via INTRAVENOUS

## 2020-11-16 NOTE — Interval H&P Note (Signed)
History and Physical Interval Note:  11/16/2020 8:52 AM  Tammy Hawkins  has presented today for surgery, with the diagnosis of colon mass.  The various methods of treatment have been discussed with the patient and family. After consideration of risks, benefits and other options for treatment, the patient has consented to  Procedure(s) with comments: FLEXIBLE SIGMOIDOSCOPY (N/A) - 9:00am as a surgical intervention.  The patient's history has been reviewed, patient examined, no change in status, stable for surgery.  I have reviewed the patient's chart and labs.  Questions were answered to the patient's satisfaction.     Eloise Harman

## 2020-11-16 NOTE — Transfer of Care (Signed)
Immediate Anesthesia Transfer of Care Note  Patient: Benn Moulder  Procedure(s) Performed: FLEXIBLE SIGMOIDOSCOPY BIOPSY SUBMUCOSAL TATTOO INJECTION  Patient Location: Short Stay  Anesthesia Type:General  Level of Consciousness: awake and alert   Airway & Oxygen Therapy: Patient Spontanous Breathing  Post-op Assessment: Report given to RN and Post -op Vital signs reviewed and stable  Post vital signs: Reviewed and stable  Last Vitals:  Vitals Value Taken Time  BP    Temp 36.4 C 11/16/20 0917  Pulse 94 11/16/20 0917  Resp 22 11/16/20 0917  SpO2 95 % 11/16/20 0917    Last Pain:  Vitals:   11/16/20 0917  TempSrc: Oral  PainSc: 0-No pain         Complications: No notable events documented.

## 2020-11-16 NOTE — Anesthesia Preprocedure Evaluation (Signed)
Anesthesia Evaluation  Patient identified by MRN, date of birth, ID band Patient awake    Reviewed: Allergy & Precautions, H&P , NPO status , Patient's Chart, lab work & pertinent test results, reviewed documented beta blocker date and time   History of Anesthesia Complications (+) PONV and history of anesthetic complications  Airway Mallampati: II  TM Distance: >3 FB Neck ROM: full    Dental no notable dental hx. (+) Teeth Intact   Pulmonary neg pulmonary ROS, former smoker,    Pulmonary exam normal breath sounds clear to auscultation       Cardiovascular Exercise Tolerance: Good hypertension, negative cardio ROS   Rhythm:regular Rate:Normal     Neuro/Psych  Neuromuscular disease negative psych ROS   GI/Hepatic negative GI ROS, Neg liver ROS,   Endo/Other  negative endocrine ROS  Renal/GU negative Renal ROS  negative genitourinary   Musculoskeletal   Abdominal   Peds  Hematology negative hematology ROS (+)   Anesthesia Other Findings Recent asymptomatic PE on CT scan, on anticoagulation  Reproductive/Obstetrics negative OB ROS                             Anesthesia Physical Anesthesia Plan  ASA: 3  Anesthesia Plan: General   Post-op Pain Management:    Induction:   PONV Risk Score and Plan: Propofol infusion  Airway Management Planned:   Additional Equipment:   Intra-op Plan:   Post-operative Plan:   Informed Consent: I have reviewed the patients History and Physical, chart, labs and discussed the procedure including the risks, benefits and alternatives for the proposed anesthesia with the patient or authorized representative who has indicated his/her understanding and acceptance.     Dental Advisory Given  Plan Discussed with: CRNA  Anesthesia Plan Comments:         Anesthesia Quick Evaluation

## 2020-11-16 NOTE — Anesthesia Postprocedure Evaluation (Signed)
Anesthesia Post Note  Patient: Tammy Hawkins  Procedure(s) Performed: [place] TATTOO INJECTION  Patient location during evaluation: Phase II Anesthesia Type: General Level of consciousness: awake Pain management: pain level controlled Vital Signs Assessment: post-procedure vital signs reviewed and stable Respiratory status: spontaneous breathing and respiratory function stable Cardiovascular status: blood pressure returned to baseline and stable Postop Assessment: no headache and no apparent nausea or vomiting Anesthetic complications: no Comments: Late entry   No notable events documented.   Last Vitals:  Vitals:   11/16/20 0917 11/16/20 0923  BP:  (!) 107/52  Pulse: 94   Resp: (!) 22   Temp: 36.4 C   SpO2: 95%     Last Pain:  Vitals:   11/16/20 0917  TempSrc: Oral  PainSc: 0-No pain                 Louann Sjogren

## 2020-11-16 NOTE — Discharge Instructions (Addendum)
  Flexible Sigmoidoscopy Discharge Instructions  Read the instructions outlined below and refer to this sheet in the next few weeks. These discharge instructions provide you with general information on caring for yourself after you leave the hospital. Your doctor may also give you specific instructions. While your treatment has been planned according to the most current medical practices available, unavoidable complications occasionally occur.   ACTIVITY You may resume your regular activity, but move at a slower pace for the next 24 hours.  Take frequent rest periods for the next 24 hours.  Walking will help get rid of the air and reduce the bloated feeling in your belly (abdomen).  No driving for 24 hours (because of the medicine (anesthesia) used during the test).   Do not sign any important legal documents or operate any machinery for 24 hours (because of the anesthesia used during the test).  NUTRITION Drink plenty of fluids.  You may resume your normal diet as instructed by your doctor.  Begin with a light meal and progress to your normal diet. Heavy or fried foods are harder to digest and may make you feel sick to your stomach (nauseated).  Avoid alcoholic beverages for 24 hours or as instructed.  MEDICATIONS You may resume your normal medications unless your doctor tells you otherwise.  WHAT YOU CAN EXPECT TODAY Some feelings of bloating in the abdomen.  Passage of more gas than usual.  Spotting of blood in your stool or on the toilet paper.  IF YOU HAD POLYPS REMOVED DURING THE COLONOSCOPY: No aspirin products for 7 days or as instructed.  No alcohol for 7 days or as instructed.  Eat a soft diet for the next 24 hours.  FINDING OUT THE RESULTS OF YOUR TEST Not all test results are available during your visit. If your test results are not back during the visit, make an appointment with your caregiver to find out the results. Do not assume everything is normal if you have not heard  from your caregiver or the medical facility. It is important for you to follow up on all of your test results.  SEEK IMMEDIATE MEDICAL ATTENTION IF: You have more than a spotting of blood in your stool.  Your belly is swollen (abdominal distention).  You are nauseated or vomiting.  You have a temperature over 101.  You have abdominal pain or discomfort that is severe or gets worse throughout the day.   Your flexible sigmoidoscopy revealed a mass in the left side your colon.  I took biopsies of this.  It does appear to be cancerous.  Await pathology results, my office will contact you.  Follow-up with Dr. Raliegh Ip of oncology 11/28/2020 at 1 PM as already scheduled.  Please hold your Xarelto until tomorrow to reduce risk of bleeding.  It was very nice meeting you today.  Elon Alas. Abbey Chatters, D.O. Gastroenterology and Hepatology [facility]

## 2020-11-16 NOTE — Op Note (Signed)
[facility] Patient Name: Tammy Hawkins Procedure Date: 11/16/2020 7:00 AM MRN: [id number] Date of Birth: 1930/05/26 Attending MD: Elon Alas. Abbey Chatters DO CSN: [id number] Age: 87 Admit Type: Ambulatory Procedure:                Flexible Sigmoidoscopy Indications:              Abnormal CT of the GI tract Providers:                Elon Alas. Abbey Chatters, DO, Crystal Page, Aram Candela Referring MD:              Medicines:                See the Anesthesia note for documentation of the                            administered medications Complications:            No immediate complications. Estimated Blood Loss:     Estimated blood loss was minimal. Procedure:                Pre-Anesthesia Assessment:                           - The anesthesia plan was to use monitored                            anesthesia care (MAC).                           After obtaining informed consent, the scope was                            passed under direct vision. The PCF-[id number]                            ([id number]) was introduced through the anus and                            advanced to the the descending colon. The flexible                            sigmoidoscopy was accomplished without difficulty.                            The patient tolerated the procedure well. The                            quality of the bowel preparation was poor. Scope In: 9:00:49 AM Scope Out: 9:12:12 AM Total Procedure Duration: 0 hours 11 minutes 23 seconds  Findings:      The perianal and digital rectal examinations were normal.      Multiple small and large-mouthed diverticula were found in the sigmoid       colon.      An infiltrative, polypoid and submucosal partially obstructing mass was       found in the distal descending colon approx 60 cm from the anal verge.       The  mass was circumferential. No bleeding was present. Biopsies were       taken with a cold forceps for histology. Area distal was tattooed with        an injection of Spot (carbon black). Impression:               - Preparation of the colon was poor.                           - Diverticulosis in the sigmoid colon.                           - Likely malignant partially obstructing tumor in                            the distal descending colon. Biopsied. Tattooed. Moderate Sedation:      Per Anesthesia Care Recommendation:           - Patient has a contact number available for                            emergencies. The signs and symptoms of potential                            delayed complications were discussed with the                            patient. Return to normal activities tomorrow.                            Written discharge instructions were provided to the                            patient.                           - Resume previous diet.                           - Continue present medications.                           - Await pathology results.                           - Follow up with Oncology next week as already                            scheduled.                           - Hold Xarelto until tomorrow to reduce risk of                            bleeding Procedure Code(s):        --- Professional ---  25366, Sigmoidoscopy, flexible; with biopsy, single                            or multiple                           45335, Sigmoidoscopy, flexible; with directed                            submucosal injection(s), any substance Diagnosis Code(s):        --- Professional ---                           D49.0, Neoplasm of unspecified behavior of                            digestive system                           K56.690, Other partial intestinal obstruction                           K57.30, Diverticulosis of large intestine without                            perforation or abscess without bleeding                           R93.3, Abnormal findings on diagnostic imaging of                             other parts of digestive tract CPT copyright 2019 [employer]. All rights reserved. The codes documented in this report are preliminary and upon coder review may  be revised to meet current compliance requirements. Elon Alas. Abbey Chatters, DO [place], DO 11/16/2020 9:20:08 AM This report has been signed electronically. Number of Addenda: 0

## 2020-11-21 LAB — SURGICAL PATHOLOGY

## 2020-11-22 ENCOUNTER — Encounter (HOSPITAL_COMMUNITY): Payer: Self-pay | Admitting: Internal Medicine

## 2020-11-23 ENCOUNTER — Other Ambulatory Visit (HOSPITAL_COMMUNITY): Payer: Self-pay | Admitting: *Deleted

## 2020-11-23 ENCOUNTER — Telehealth (HOSPITAL_COMMUNITY): Payer: Self-pay | Admitting: *Deleted

## 2020-11-23 DIAGNOSIS — C189 Malignant neoplasm of colon, unspecified: Secondary | ICD-10-CM | POA: Diagnosis not present

## 2020-11-23 NOTE — Telephone Encounter (Signed)
Received TC from caregiver, Adora Fridge.  States that her status has declined immensely since her initial visit.  Now needs total assistance with all ADLs, sleeps for the better part of the day, which is a change since she was seen.  Having increased edema to legs and feet, associated with weeping.  Also states that the stools she does have are black. Due to the extent of her disease process she was inquiring as to if she needs to keep her appointment on the 18 th or peruse a different care route.

## 2020-11-23 NOTE — Telephone Encounter (Signed)
Spoke with Zigmund Daniel and I will put in a referral for Palliative care at this point.  Verbalized understanding.

## 2020-11-24 ENCOUNTER — Encounter (HOSPITAL_COMMUNITY): Payer: Self-pay

## 2020-11-26 DIAGNOSIS — I499 Cardiac arrhythmia, unspecified: Secondary | ICD-10-CM | POA: Diagnosis not present

## 2020-11-26 DIAGNOSIS — I2699 Other pulmonary embolism without acute cor pulmonale: Secondary | ICD-10-CM | POA: Diagnosis not present

## 2020-11-26 DIAGNOSIS — Z743 Need for continuous supervision: Secondary | ICD-10-CM | POA: Diagnosis not present

## 2020-11-26 DIAGNOSIS — I700 Atherosclerosis of aorta: Secondary | ICD-10-CM | POA: Diagnosis not present

## 2020-11-26 DIAGNOSIS — C189 Malignant neoplasm of colon, unspecified: Secondary | ICD-10-CM | POA: Diagnosis not present

## 2020-11-26 DIAGNOSIS — D72829 Elevated white blood cell count, unspecified: Secondary | ICD-10-CM | POA: Diagnosis not present

## 2020-11-26 DIAGNOSIS — C787 Secondary malignant neoplasm of liver and intrahepatic bile duct: Secondary | ICD-10-CM | POA: Diagnosis not present

## 2020-11-26 DIAGNOSIS — R279 Unspecified lack of coordination: Secondary | ICD-10-CM | POA: Diagnosis not present

## 2020-11-26 DIAGNOSIS — N390 Urinary tract infection, site not specified: Secondary | ICD-10-CM | POA: Diagnosis not present

## 2020-11-26 DIAGNOSIS — K769 Liver disease, unspecified: Secondary | ICD-10-CM | POA: Diagnosis not present

## 2020-11-26 DIAGNOSIS — Z7401 Bed confinement status: Secondary | ICD-10-CM | POA: Diagnosis not present

## 2020-11-26 DIAGNOSIS — Z1611 Resistance to penicillins: Secondary | ICD-10-CM | POA: Diagnosis not present

## 2020-11-26 DIAGNOSIS — Z7901 Long term (current) use of anticoagulants: Secondary | ICD-10-CM | POA: Diagnosis not present

## 2020-11-26 DIAGNOSIS — R0902 Hypoxemia: Secondary | ICD-10-CM | POA: Diagnosis not present

## 2020-11-26 DIAGNOSIS — R6889 Other general symptoms and signs: Secondary | ICD-10-CM | POA: Diagnosis not present

## 2020-11-26 DIAGNOSIS — A419 Sepsis, unspecified organism: Secondary | ICD-10-CM | POA: Diagnosis not present

## 2020-11-26 DIAGNOSIS — E876 Hypokalemia: Secondary | ICD-10-CM | POA: Diagnosis not present

## 2020-11-26 DIAGNOSIS — R918 Other nonspecific abnormal finding of lung field: Secondary | ICD-10-CM | POA: Diagnosis not present

## 2020-11-26 DIAGNOSIS — Z20822 Contact with and (suspected) exposure to covid-19: Secondary | ICD-10-CM | POA: Diagnosis not present

## 2020-11-26 DIAGNOSIS — J90 Pleural effusion, not elsewhere classified: Secondary | ICD-10-CM | POA: Diagnosis not present

## 2020-11-26 DIAGNOSIS — R0602 Shortness of breath: Secondary | ICD-10-CM | POA: Diagnosis not present

## 2020-11-26 DIAGNOSIS — Z9981 Dependence on supplemental oxygen: Secondary | ICD-10-CM | POA: Diagnosis not present

## 2020-11-26 DIAGNOSIS — C7989 Secondary malignant neoplasm of other specified sites: Secondary | ICD-10-CM | POA: Diagnosis not present

## 2020-11-26 DIAGNOSIS — R5381 Other malaise: Secondary | ICD-10-CM | POA: Diagnosis not present

## 2020-11-26 DIAGNOSIS — Z66 Do not resuscitate: Secondary | ICD-10-CM | POA: Diagnosis not present

## 2020-11-26 DIAGNOSIS — J9811 Atelectasis: Secondary | ICD-10-CM | POA: Diagnosis not present

## 2020-11-26 DIAGNOSIS — I10 Essential (primary) hypertension: Secondary | ICD-10-CM | POA: Diagnosis not present

## 2020-11-26 DIAGNOSIS — Z85038 Personal history of other malignant neoplasm of large intestine: Secondary | ICD-10-CM | POA: Diagnosis not present

## 2020-11-26 DIAGNOSIS — R404 Transient alteration of awareness: Secondary | ICD-10-CM | POA: Diagnosis not present

## 2020-11-26 DIAGNOSIS — Z515 Encounter for palliative care: Secondary | ICD-10-CM | POA: Diagnosis not present

## 2020-11-26 DIAGNOSIS — C7800 Secondary malignant neoplasm of unspecified lung: Secondary | ICD-10-CM | POA: Diagnosis not present

## 2020-11-28 ENCOUNTER — Ambulatory Visit (HOSPITAL_COMMUNITY): Payer: Medicare Other | Admitting: Hematology

## 2020-12-11 DIAGNOSIS — E785 Hyperlipidemia, unspecified: Secondary | ICD-10-CM | POA: Diagnosis not present

## 2020-12-11 DIAGNOSIS — I10 Essential (primary) hypertension: Secondary | ICD-10-CM | POA: Diagnosis not present

## 2020-12-12 DEATH — deceased

## 2022-01-08 IMAGING — CT CT CHEST W/ CM
2 of 3 series · 15 of 36 positions shown, 18 images · IV contrast (Omnipaque or Isovue)
Comparison: CT abdomen pelvis, 11/01/2020

CLINICAL DATA: New diagnosis colon cancer, chest staging

EXAM:
CT CHEST WITH CONTRAST
TECHNIQUE: Multidetector CT imaging of the chest was performed during
intravenous contrast administration.
CONTRAST:  60mL OMNIPAQUE IOHEXOL 350 MG/ML SOLN

[Series 2: routine chest with · axial · 0.68mm/px · z∈[+1268,+1542]mm · 12 of 161 slices shown, 15 images]
[im 12/161  mediastinal]
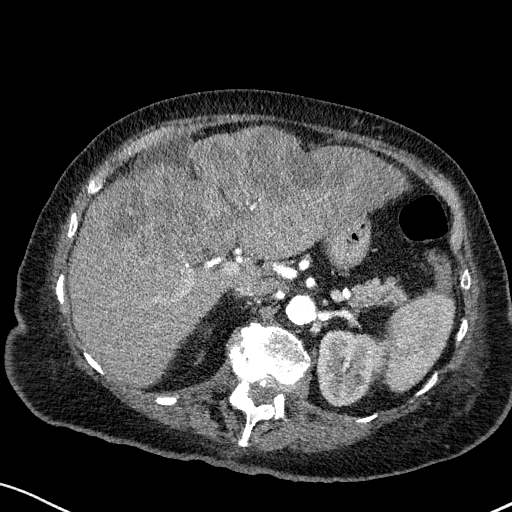
[im 12/161  lung]
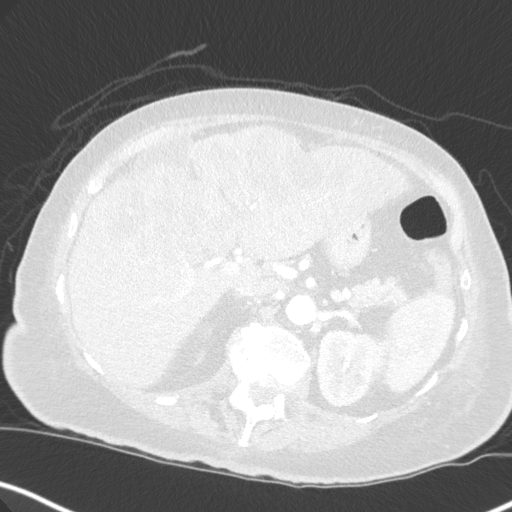
[im 24/161  lung]
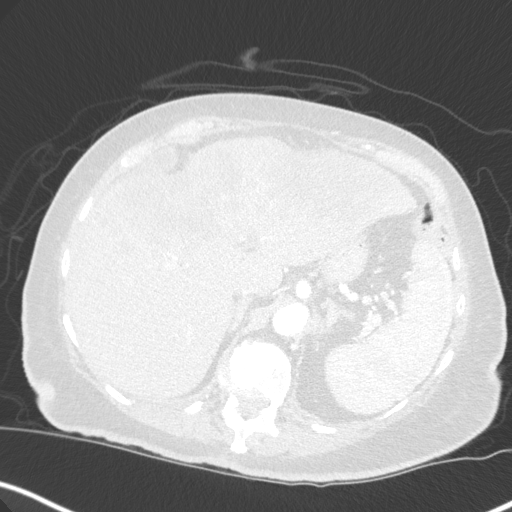
[im 36/161  lung]
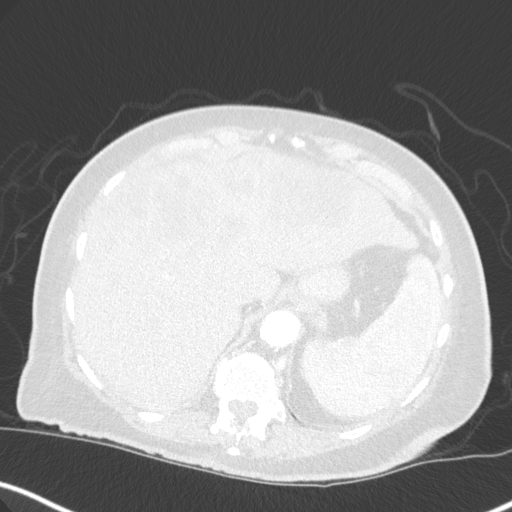
[im 48/161  lung]
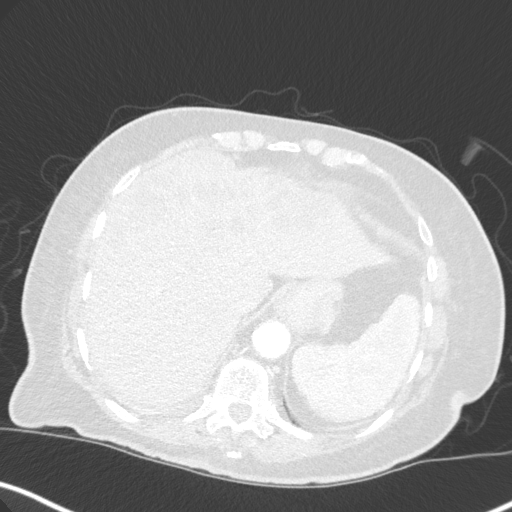
[im 60/161  mediastinal]
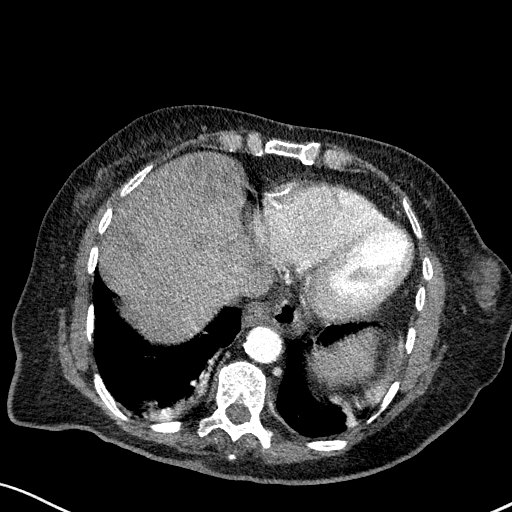
[im 60/161  lung]
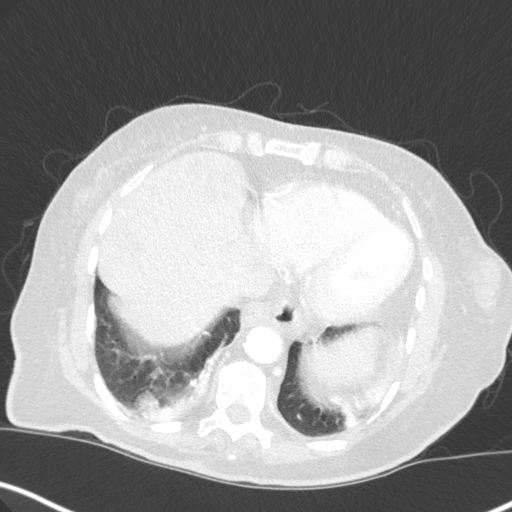
[im 72/161  lung]
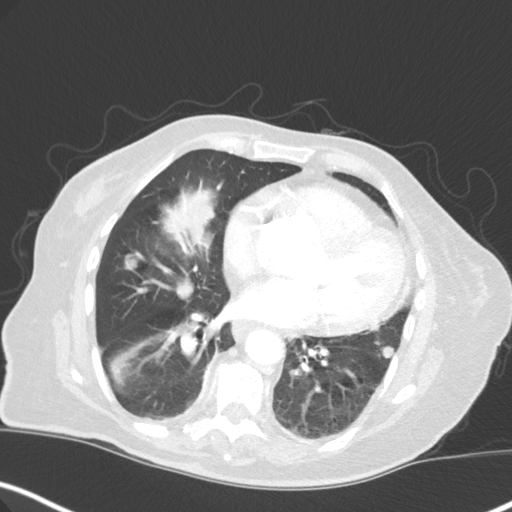
[im 89/161  lung]
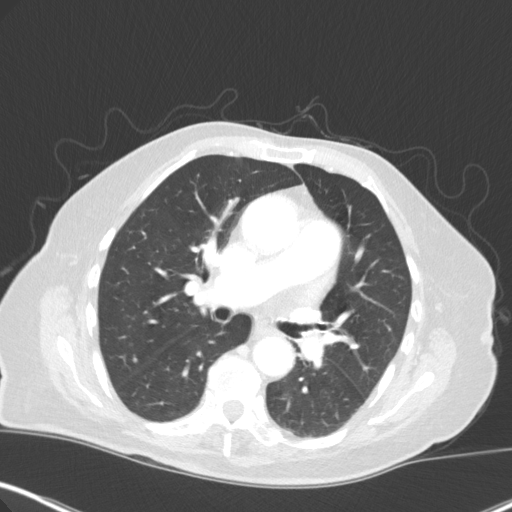
[im 101/161  lung]
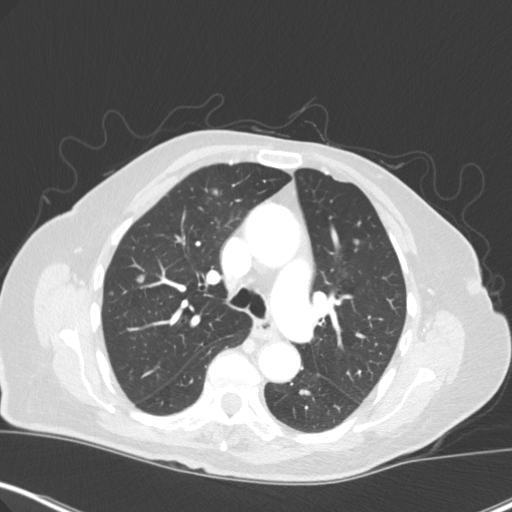
[im 113/161  mediastinal]
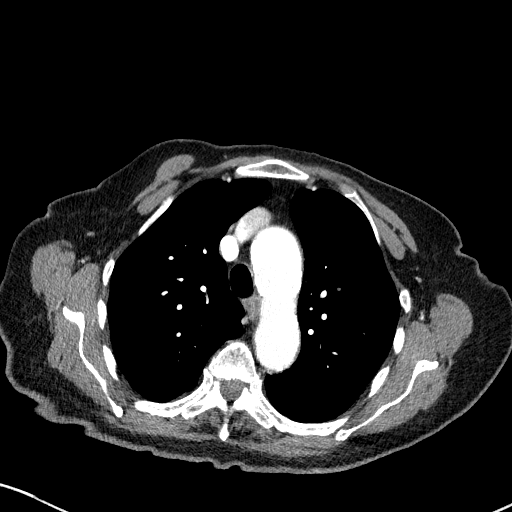
[im 113/161  lung]
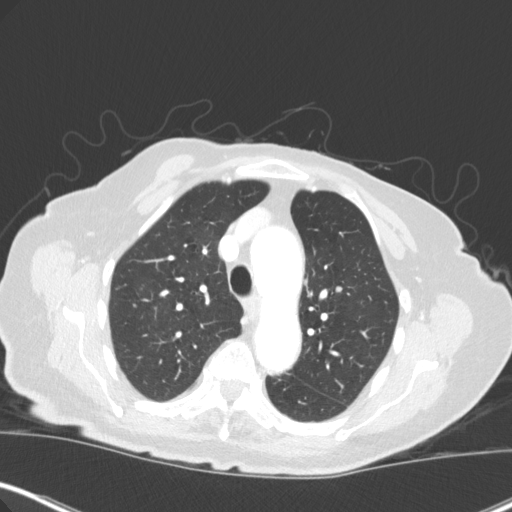
[im 125/161  lung]
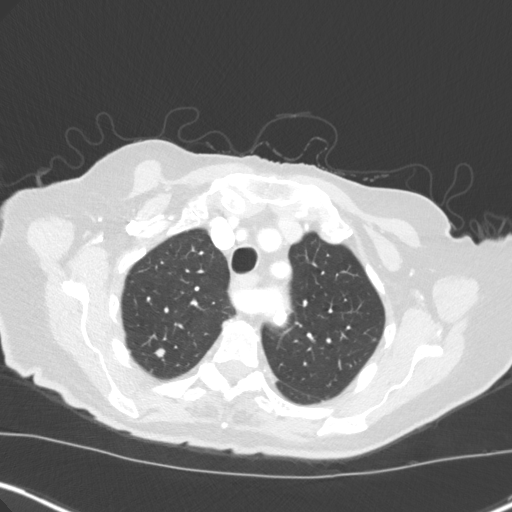
[im 137/161  lung]
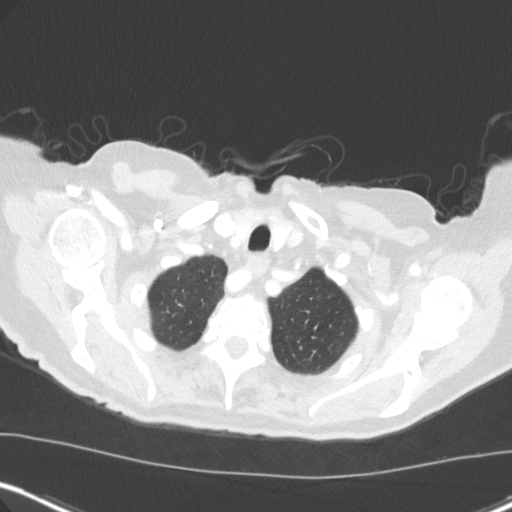
[im 149/161  lung]
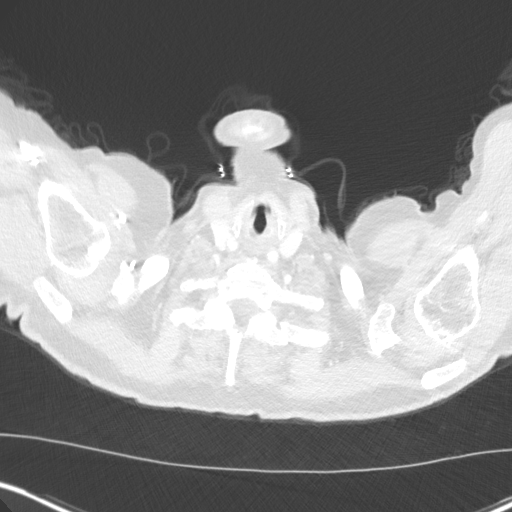

[Series 5: coronal · coronal · 0.67mm/px · 3 of 148 slices shown]
[im 30/148  lung]
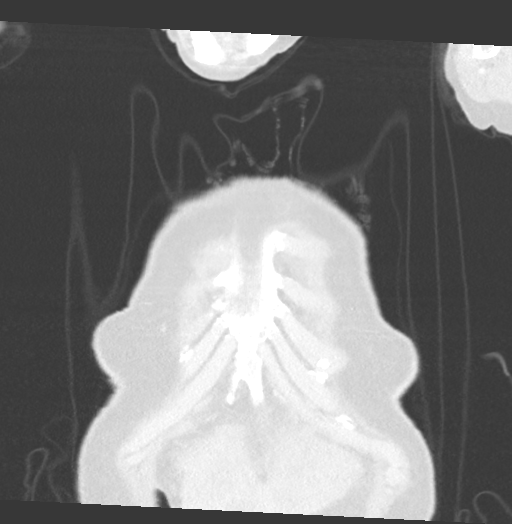
[im 59/148  lung]
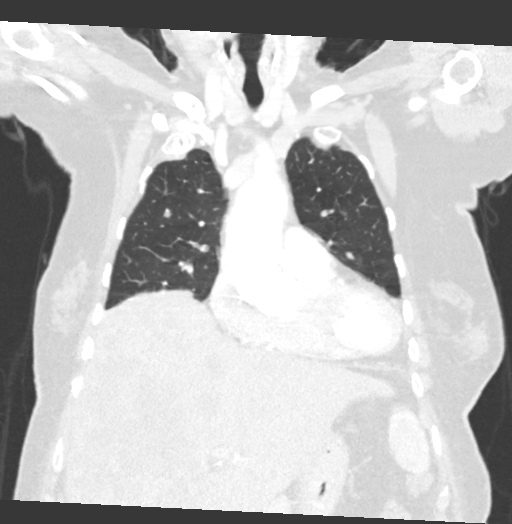
[im 89/148  lung]
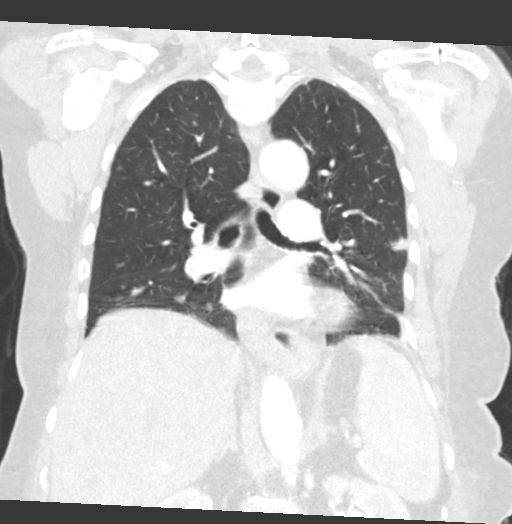

[15 of 36 positions shown; findings below may reference images not displayed]

FINDINGS: Cardiovascular: Aortic atherosclerosis. Normal heart size.
Three-vessel coronary artery calcifications. No pericardial
effusion. Incidental note of lobar embolus present in the right
lower lobe (series 2, image 82).

Mediastinum/Nodes: No enlarged mediastinal, hilar, or axillary lymph
nodes. Thyroid gland, trachea, and esophagus demonstrate no
significant findings.

Lungs/Pleura: Trace bilateral pleural effusions. Numerous small
pulmonary nodules throughout the lungs, index nodule of the lateral
segment right middle lobe measuring 1.6 x 1.2 cm (series 4, image
88), index nodule of the peripheral left upper lobe measuring 1.3 x
1.1 cm (series 4, image 70).

Upper Abdomen: No acute abnormality. Innumerable hypodense
metastatic lesions throughout the included liver.

Musculoskeletal: No chest wall mass or suspicious bone lesions
identified.
IMPRESSION: 1. Numerous small pulmonary nodules throughout the lungs, consistent
with metastatic disease.
2. Innumerable hypodense metastatic lesions throughout the included
liver, better assessed by prior CT of the abdomen and pelvis.
3. Incidental note of lobar pulmonary embolus present in the right
lower lobe.
4. Trace bilateral pleural effusions.
5. Coronary artery disease.

Call report request was placed at the time of interpretation. Final
communication will be documented.

Aortic Atherosclerosis ([id number]-CJX.X).
